# Patient Record
Sex: Male | Born: 1935
Health system: Southern US, Community
[De-identification: ages and names within clinical notes are randomized; demographics above are authoritative.]

## PROBLEM LIST (undated history)

## (undated) DIAGNOSIS — I10 Essential (primary) hypertension: Secondary | ICD-10-CM

## (undated) DIAGNOSIS — H269 Unspecified cataract: Secondary | ICD-10-CM

## (undated) HISTORY — DX: Essential (primary) hypertension: I10

## (undated) HISTORY — PX: NOSE SURGERY: SHX723

## (undated) HISTORY — PX: CATARACT EXTRACTION: SUR2

## (undated) HISTORY — DX: Unspecified cataract: H26.9

## (undated) HISTORY — PX: EYE SURGERY: SHX253

---

## 2001-07-16 ENCOUNTER — Encounter: Admission: RE | Admit: 2001-07-16 | Discharge: 2001-07-16 | Payer: Self-pay | Admitting: Internal Medicine

## 2001-07-16 ENCOUNTER — Encounter: Payer: Self-pay | Admitting: Internal Medicine

## 2004-09-07 ENCOUNTER — Ambulatory Visit: Payer: Self-pay | Admitting: Internal Medicine

## 2004-09-24 ENCOUNTER — Encounter (INDEPENDENT_AMBULATORY_CARE_PROVIDER_SITE_OTHER): Payer: Self-pay | Admitting: Specialist

## 2004-09-24 ENCOUNTER — Ambulatory Visit: Payer: Self-pay | Admitting: Internal Medicine

## 2009-09-06 ENCOUNTER — Encounter (INDEPENDENT_AMBULATORY_CARE_PROVIDER_SITE_OTHER): Payer: Self-pay | Admitting: *Deleted

## 2009-09-20 ENCOUNTER — Encounter (INDEPENDENT_AMBULATORY_CARE_PROVIDER_SITE_OTHER): Payer: Self-pay | Admitting: *Deleted

## 2009-10-26 ENCOUNTER — Ambulatory Visit: Payer: Self-pay | Admitting: Internal Medicine

## 2009-10-30 ENCOUNTER — Encounter: Admission: RE | Admit: 2009-10-30 | Discharge: 2009-10-30 | Payer: Self-pay | Admitting: General Surgery

## 2010-04-10 NOTE — Letter (Signed)
Summary: Previsit letter  [facility]  [address] [place], [place] [address]   Phone: [phone]  Fax: [phone]       09/20/2009 MRN: [id number]  Robert Holder [address] [place], [place]  [address]  Dear [name],  Welcome to the Gastroenterology Division at [facility].    You are scheduled to see a nurse for your pre-procedure visit on 10-26-09 at 3:30p.m. on the 3rd floor at [facility], [address]  We ask that you try to arrive at our office 15 minutes prior to your appointment time to allow for check-in.  Your nurse visit will consist of discussing your medical and surgical history, your immediate family medical history, and your medications.    Please bring a complete list of all your medications or, if you prefer, bring the medication bottles and we will list them.  We will need to be aware of both prescribed and over the counter drugs.  We will need to know exact dosage information as well.  If you are on blood thinners (Coumadin, Plavix, Aggrenox, Ticlid, etc.) please call our office today/prior to your appointment, as we need to consult with your physician about holding your medication.   Please be prepared to read and sign documents such as consent forms, a financial agreement, and acknowledgement forms.  If necessary, and with your consent, a friend or relative is welcome to sit-in on the nurse visit with you.  Please bring your insurance card so that we may make a copy of it.  If your insurance requires a referral to see a specialist, please bring your referral form from your primary care physician.  No co-pay is required for this nurse visit.     If you cannot keep your appointment, please call [phone] to cancel or reschedule prior to your appointment date.  This allows [place] the opportunity to schedule an appointment for another patient in need of care.    Thank you for choosing [facility] for your medical  needs.  We appreciate the opportunity to care for you.  Please visit [place] at our website  to learn more about our practice.                     Sincerely.                                                                                                                   The Gastroenterology Division

## 2010-04-10 NOTE — Letter (Signed)
Summary: Colonoscopy Letter  [facility]  [address] [place], [place] [address]   Phone: [phone]  Fax: [phone]      September 06, 2009 MRN: [id number]   Robert Holder [address] [place], [place]  [address]   Dear [name],   According to your medical record, it is time for you to schedule a Colonoscopy. The American Cancer Society recommends this procedure as a method to detect early colon cancer. Patients with a family history of colon cancer, or a personal history of colon polyps or inflammatory bowel disease are at increased risk.  This letter has been generated based on the recommendations made at the time of your procedure. If you feel that in your particular situation this may no longer apply, please contact our office.  Please call our office at [phone] to schedule this appointment or to update your records at your earliest convenience.  Thank you for cooperating with [place] to provide you with the very best care possible.   Sincerely,  Roxy Cedar, M.D.  [facility] [phone]

## 2010-04-10 NOTE — Miscellaneous (Signed)
Summary: [facility] Previsit/prep  Clinical Lists Changes     Robert Holder says he's having left inguinal hernia surgery August 29 and wants to cancel his colonoscopy scheduled for 11/21/09.  He will call back and [facility] his PV and Colon after he has recovered from surgery.

## 2010-05-11 ENCOUNTER — Other Ambulatory Visit: Payer: Self-pay | Admitting: Dermatology

## 2010-11-22 ENCOUNTER — Other Ambulatory Visit: Payer: Self-pay | Admitting: Dermatology

## 2011-04-29 DIAGNOSIS — E039 Hypothyroidism, unspecified: Secondary | ICD-10-CM | POA: Diagnosis not present

## 2011-04-29 DIAGNOSIS — E871 Hypo-osmolality and hyponatremia: Secondary | ICD-10-CM | POA: Diagnosis not present

## 2011-04-29 DIAGNOSIS — E785 Hyperlipidemia, unspecified: Secondary | ICD-10-CM | POA: Diagnosis not present

## 2011-04-29 DIAGNOSIS — I10 Essential (primary) hypertension: Secondary | ICD-10-CM | POA: Diagnosis not present

## 2011-05-15 ENCOUNTER — Telehealth: Payer: Self-pay | Admitting: Internal Medicine

## 2011-05-15 NOTE — Telephone Encounter (Signed)
Pt stated he will call when ready

## 2011-08-08 ENCOUNTER — Other Ambulatory Visit: Payer: Self-pay | Admitting: Dermatology

## 2011-08-08 DIAGNOSIS — D239 Other benign neoplasm of skin, unspecified: Secondary | ICD-10-CM | POA: Diagnosis not present

## 2011-08-08 DIAGNOSIS — C44319 Basal cell carcinoma of skin of other parts of face: Secondary | ICD-10-CM | POA: Diagnosis not present

## 2011-08-08 DIAGNOSIS — L570 Actinic keratosis: Secondary | ICD-10-CM | POA: Diagnosis not present

## 2011-08-08 DIAGNOSIS — L821 Other seborrheic keratosis: Secondary | ICD-10-CM | POA: Diagnosis not present

## 2011-09-09 DIAGNOSIS — C44319 Basal cell carcinoma of skin of other parts of face: Secondary | ICD-10-CM | POA: Diagnosis not present

## 2011-09-16 ENCOUNTER — Other Ambulatory Visit: Payer: Self-pay | Admitting: Dermatology

## 2011-09-16 DIAGNOSIS — C44319 Basal cell carcinoma of skin of other parts of face: Secondary | ICD-10-CM | POA: Diagnosis not present

## 2011-09-16 DIAGNOSIS — L821 Other seborrheic keratosis: Secondary | ICD-10-CM | POA: Diagnosis not present

## 2011-10-24 DIAGNOSIS — Z125 Encounter for screening for malignant neoplasm of prostate: Secondary | ICD-10-CM | POA: Diagnosis not present

## 2011-10-24 DIAGNOSIS — I10 Essential (primary) hypertension: Secondary | ICD-10-CM | POA: Diagnosis not present

## 2011-10-24 DIAGNOSIS — E785 Hyperlipidemia, unspecified: Secondary | ICD-10-CM | POA: Diagnosis not present

## 2011-10-24 DIAGNOSIS — E039 Hypothyroidism, unspecified: Secondary | ICD-10-CM | POA: Diagnosis not present

## 2011-10-29 DIAGNOSIS — Z1212 Encounter for screening for malignant neoplasm of rectum: Secondary | ICD-10-CM | POA: Diagnosis not present

## 2011-10-31 DIAGNOSIS — I10 Essential (primary) hypertension: Secondary | ICD-10-CM | POA: Diagnosis not present

## 2011-10-31 DIAGNOSIS — H3530 Unspecified macular degeneration: Secondary | ICD-10-CM | POA: Diagnosis not present

## 2011-10-31 DIAGNOSIS — E039 Hypothyroidism, unspecified: Secondary | ICD-10-CM | POA: Diagnosis not present

## 2011-10-31 DIAGNOSIS — Z0000 Encounter for general adult medical examination without abnormal findings: Secondary | ICD-10-CM | POA: Diagnosis not present

## 2011-11-19 DIAGNOSIS — Z23 Encounter for immunization: Secondary | ICD-10-CM | POA: Diagnosis not present

## 2011-12-27 DIAGNOSIS — N400 Enlarged prostate without lower urinary tract symptoms: Secondary | ICD-10-CM | POA: Diagnosis not present

## 2011-12-27 DIAGNOSIS — R972 Elevated prostate specific antigen [PSA]: Secondary | ICD-10-CM | POA: Diagnosis not present

## 2011-12-27 DIAGNOSIS — N529 Male erectile dysfunction, unspecified: Secondary | ICD-10-CM | POA: Diagnosis not present

## 2012-02-19 DIAGNOSIS — D1801 Hemangioma of skin and subcutaneous tissue: Secondary | ICD-10-CM | POA: Diagnosis not present

## 2012-02-19 DIAGNOSIS — L821 Other seborrheic keratosis: Secondary | ICD-10-CM | POA: Diagnosis not present

## 2012-02-19 DIAGNOSIS — D239 Other benign neoplasm of skin, unspecified: Secondary | ICD-10-CM | POA: Diagnosis not present

## 2012-02-19 DIAGNOSIS — D485 Neoplasm of uncertain behavior of skin: Secondary | ICD-10-CM | POA: Diagnosis not present

## 2012-02-19 DIAGNOSIS — L570 Actinic keratosis: Secondary | ICD-10-CM | POA: Diagnosis not present

## 2012-03-10 DIAGNOSIS — Z961 Presence of intraocular lens: Secondary | ICD-10-CM | POA: Diagnosis not present

## 2012-03-19 ENCOUNTER — Other Ambulatory Visit: Payer: Self-pay | Admitting: Dermatology

## 2012-03-19 DIAGNOSIS — L820 Inflamed seborrheic keratosis: Secondary | ICD-10-CM | POA: Diagnosis not present

## 2012-03-19 DIAGNOSIS — D485 Neoplasm of uncertain behavior of skin: Secondary | ICD-10-CM | POA: Diagnosis not present

## 2012-03-19 DIAGNOSIS — C44519 Basal cell carcinoma of skin of other part of trunk: Secondary | ICD-10-CM | POA: Diagnosis not present

## 2012-03-19 DIAGNOSIS — L821 Other seborrheic keratosis: Secondary | ICD-10-CM | POA: Diagnosis not present

## 2012-06-04 DIAGNOSIS — I10 Essential (primary) hypertension: Secondary | ICD-10-CM | POA: Diagnosis not present

## 2012-06-04 DIAGNOSIS — E871 Hypo-osmolality and hyponatremia: Secondary | ICD-10-CM | POA: Diagnosis not present

## 2012-06-04 DIAGNOSIS — E785 Hyperlipidemia, unspecified: Secondary | ICD-10-CM | POA: Diagnosis not present

## 2012-06-04 DIAGNOSIS — E039 Hypothyroidism, unspecified: Secondary | ICD-10-CM | POA: Diagnosis not present

## 2012-06-04 DIAGNOSIS — H3530 Unspecified macular degeneration: Secondary | ICD-10-CM | POA: Diagnosis not present

## 2012-06-04 DIAGNOSIS — Z1331 Encounter for screening for depression: Secondary | ICD-10-CM | POA: Diagnosis not present

## 2012-06-04 DIAGNOSIS — N401 Enlarged prostate with lower urinary tract symptoms: Secondary | ICD-10-CM | POA: Diagnosis not present

## 2012-08-20 DIAGNOSIS — Z85828 Personal history of other malignant neoplasm of skin: Secondary | ICD-10-CM | POA: Diagnosis not present

## 2012-08-20 DIAGNOSIS — L570 Actinic keratosis: Secondary | ICD-10-CM | POA: Diagnosis not present

## 2012-08-20 DIAGNOSIS — L821 Other seborrheic keratosis: Secondary | ICD-10-CM | POA: Diagnosis not present

## 2012-08-20 DIAGNOSIS — L820 Inflamed seborrheic keratosis: Secondary | ICD-10-CM | POA: Diagnosis not present

## 2012-11-27 DIAGNOSIS — I10 Essential (primary) hypertension: Secondary | ICD-10-CM | POA: Diagnosis not present

## 2012-11-27 DIAGNOSIS — Z125 Encounter for screening for malignant neoplasm of prostate: Secondary | ICD-10-CM | POA: Diagnosis not present

## 2012-11-27 DIAGNOSIS — E039 Hypothyroidism, unspecified: Secondary | ICD-10-CM | POA: Diagnosis not present

## 2012-11-27 DIAGNOSIS — E785 Hyperlipidemia, unspecified: Secondary | ICD-10-CM | POA: Diagnosis not present

## 2012-12-02 DIAGNOSIS — Z1212 Encounter for screening for malignant neoplasm of rectum: Secondary | ICD-10-CM | POA: Diagnosis not present

## 2012-12-04 DIAGNOSIS — E785 Hyperlipidemia, unspecified: Secondary | ICD-10-CM | POA: Diagnosis not present

## 2012-12-04 DIAGNOSIS — D72829 Elevated white blood cell count, unspecified: Secondary | ICD-10-CM | POA: Diagnosis not present

## 2012-12-04 DIAGNOSIS — C4491 Basal cell carcinoma of skin, unspecified: Secondary | ICD-10-CM | POA: Diagnosis not present

## 2012-12-04 DIAGNOSIS — K3189 Other diseases of stomach and duodenum: Secondary | ICD-10-CM | POA: Diagnosis not present

## 2012-12-04 DIAGNOSIS — M1990 Unspecified osteoarthritis, unspecified site: Secondary | ICD-10-CM | POA: Diagnosis not present

## 2012-12-04 DIAGNOSIS — I10 Essential (primary) hypertension: Secondary | ICD-10-CM | POA: Diagnosis not present

## 2012-12-04 DIAGNOSIS — Z0000 Encounter for general adult medical examination without abnormal findings: Secondary | ICD-10-CM | POA: Diagnosis not present

## 2012-12-04 DIAGNOSIS — Z23 Encounter for immunization: Secondary | ICD-10-CM | POA: Diagnosis not present

## 2012-12-04 DIAGNOSIS — G4700 Insomnia, unspecified: Secondary | ICD-10-CM | POA: Diagnosis not present

## 2013-01-06 DIAGNOSIS — R351 Nocturia: Secondary | ICD-10-CM | POA: Diagnosis not present

## 2013-03-12 DIAGNOSIS — D3130 Benign neoplasm of unspecified choroid: Secondary | ICD-10-CM | POA: Diagnosis not present

## 2013-03-25 DIAGNOSIS — D1801 Hemangioma of skin and subcutaneous tissue: Secondary | ICD-10-CM | POA: Diagnosis not present

## 2013-03-25 DIAGNOSIS — D239 Other benign neoplasm of skin, unspecified: Secondary | ICD-10-CM | POA: Diagnosis not present

## 2013-03-25 DIAGNOSIS — L821 Other seborrheic keratosis: Secondary | ICD-10-CM | POA: Diagnosis not present

## 2013-03-25 DIAGNOSIS — L820 Inflamed seborrheic keratosis: Secondary | ICD-10-CM | POA: Diagnosis not present

## 2013-03-25 DIAGNOSIS — Z85828 Personal history of other malignant neoplasm of skin: Secondary | ICD-10-CM | POA: Diagnosis not present

## 2013-03-25 DIAGNOSIS — L570 Actinic keratosis: Secondary | ICD-10-CM | POA: Diagnosis not present

## 2013-04-06 DIAGNOSIS — Z6826 Body mass index (BMI) 26.0-26.9, adult: Secondary | ICD-10-CM | POA: Diagnosis not present

## 2013-04-06 DIAGNOSIS — J069 Acute upper respiratory infection, unspecified: Secondary | ICD-10-CM | POA: Diagnosis not present

## 2013-04-06 DIAGNOSIS — I10 Essential (primary) hypertension: Secondary | ICD-10-CM | POA: Diagnosis not present

## 2013-04-06 DIAGNOSIS — R059 Cough, unspecified: Secondary | ICD-10-CM | POA: Diagnosis not present

## 2013-04-06 DIAGNOSIS — R05 Cough: Secondary | ICD-10-CM | POA: Diagnosis not present

## 2013-06-03 DIAGNOSIS — E785 Hyperlipidemia, unspecified: Secondary | ICD-10-CM | POA: Diagnosis not present

## 2013-06-03 DIAGNOSIS — H3530 Unspecified macular degeneration: Secondary | ICD-10-CM | POA: Diagnosis not present

## 2013-06-03 DIAGNOSIS — E039 Hypothyroidism, unspecified: Secondary | ICD-10-CM | POA: Diagnosis not present

## 2013-06-03 DIAGNOSIS — N401 Enlarged prostate with lower urinary tract symptoms: Secondary | ICD-10-CM | POA: Diagnosis not present

## 2013-06-03 DIAGNOSIS — N138 Other obstructive and reflux uropathy: Secondary | ICD-10-CM | POA: Diagnosis not present

## 2013-06-03 DIAGNOSIS — I10 Essential (primary) hypertension: Secondary | ICD-10-CM | POA: Diagnosis not present

## 2013-09-23 ENCOUNTER — Other Ambulatory Visit: Payer: Self-pay | Admitting: Dermatology

## 2013-09-23 DIAGNOSIS — D239 Other benign neoplasm of skin, unspecified: Secondary | ICD-10-CM | POA: Diagnosis not present

## 2013-09-23 DIAGNOSIS — L570 Actinic keratosis: Secondary | ICD-10-CM | POA: Diagnosis not present

## 2013-09-23 DIAGNOSIS — L820 Inflamed seborrheic keratosis: Secondary | ICD-10-CM | POA: Diagnosis not present

## 2013-09-23 DIAGNOSIS — C44519 Basal cell carcinoma of skin of other part of trunk: Secondary | ICD-10-CM | POA: Diagnosis not present

## 2013-09-23 DIAGNOSIS — L821 Other seborrheic keratosis: Secondary | ICD-10-CM | POA: Diagnosis not present

## 2013-09-23 DIAGNOSIS — D485 Neoplasm of uncertain behavior of skin: Secondary | ICD-10-CM | POA: Diagnosis not present

## 2013-09-23 DIAGNOSIS — Z85828 Personal history of other malignant neoplasm of skin: Secondary | ICD-10-CM | POA: Diagnosis not present

## 2013-12-06 DIAGNOSIS — E039 Hypothyroidism, unspecified: Secondary | ICD-10-CM | POA: Diagnosis not present

## 2013-12-06 DIAGNOSIS — E785 Hyperlipidemia, unspecified: Secondary | ICD-10-CM | POA: Diagnosis not present

## 2013-12-06 DIAGNOSIS — Z125 Encounter for screening for malignant neoplasm of prostate: Secondary | ICD-10-CM | POA: Diagnosis not present

## 2013-12-06 DIAGNOSIS — I10 Essential (primary) hypertension: Secondary | ICD-10-CM | POA: Diagnosis not present

## 2013-12-09 DIAGNOSIS — K30 Functional dyspepsia: Secondary | ICD-10-CM | POA: Diagnosis not present

## 2013-12-09 DIAGNOSIS — C4491 Basal cell carcinoma of skin, unspecified: Secondary | ICD-10-CM | POA: Diagnosis not present

## 2013-12-09 DIAGNOSIS — Z008 Encounter for other general examination: Secondary | ICD-10-CM | POA: Diagnosis not present

## 2013-12-09 DIAGNOSIS — Z1389 Encounter for screening for other disorder: Secondary | ICD-10-CM | POA: Diagnosis not present

## 2013-12-09 DIAGNOSIS — I10 Essential (primary) hypertension: Secondary | ICD-10-CM | POA: Diagnosis not present

## 2013-12-09 DIAGNOSIS — N401 Enlarged prostate with lower urinary tract symptoms: Secondary | ICD-10-CM | POA: Diagnosis not present

## 2013-12-09 DIAGNOSIS — F5104 Psychophysiologic insomnia: Secondary | ICD-10-CM | POA: Diagnosis not present

## 2013-12-09 DIAGNOSIS — Z23 Encounter for immunization: Secondary | ICD-10-CM | POA: Diagnosis not present

## 2013-12-09 DIAGNOSIS — E871 Hypo-osmolality and hyponatremia: Secondary | ICD-10-CM | POA: Diagnosis not present

## 2013-12-09 DIAGNOSIS — D72829 Elevated white blood cell count, unspecified: Secondary | ICD-10-CM | POA: Diagnosis not present

## 2013-12-13 DIAGNOSIS — Z1212 Encounter for screening for malignant neoplasm of rectum: Secondary | ICD-10-CM | POA: Diagnosis not present

## 2014-01-07 DIAGNOSIS — R351 Nocturia: Secondary | ICD-10-CM | POA: Diagnosis not present

## 2014-01-07 DIAGNOSIS — N401 Enlarged prostate with lower urinary tract symptoms: Secondary | ICD-10-CM | POA: Diagnosis not present

## 2014-01-07 DIAGNOSIS — R972 Elevated prostate specific antigen [PSA]: Secondary | ICD-10-CM | POA: Diagnosis not present

## 2014-03-16 DIAGNOSIS — H26493 Other secondary cataract, bilateral: Secondary | ICD-10-CM | POA: Diagnosis not present

## 2014-03-16 DIAGNOSIS — D3131 Benign neoplasm of right choroid: Secondary | ICD-10-CM | POA: Diagnosis not present

## 2014-03-16 DIAGNOSIS — Z961 Presence of intraocular lens: Secondary | ICD-10-CM | POA: Diagnosis not present

## 2014-03-16 DIAGNOSIS — H5212 Myopia, left eye: Secondary | ICD-10-CM | POA: Diagnosis not present

## 2014-03-24 DIAGNOSIS — R590 Localized enlarged lymph nodes: Secondary | ICD-10-CM | POA: Diagnosis not present

## 2014-03-24 DIAGNOSIS — I10 Essential (primary) hypertension: Secondary | ICD-10-CM | POA: Diagnosis not present

## 2014-03-24 DIAGNOSIS — L820 Inflamed seborrheic keratosis: Secondary | ICD-10-CM | POA: Diagnosis not present

## 2014-03-24 DIAGNOSIS — L570 Actinic keratosis: Secondary | ICD-10-CM | POA: Diagnosis not present

## 2014-03-24 DIAGNOSIS — D2271 Melanocytic nevi of right lower limb, including hip: Secondary | ICD-10-CM | POA: Diagnosis not present

## 2014-03-24 DIAGNOSIS — L821 Other seborrheic keratosis: Secondary | ICD-10-CM | POA: Diagnosis not present

## 2014-03-24 DIAGNOSIS — Z85828 Personal history of other malignant neoplasm of skin: Secondary | ICD-10-CM | POA: Diagnosis not present

## 2014-03-24 DIAGNOSIS — Z6826 Body mass index (BMI) 26.0-26.9, adult: Secondary | ICD-10-CM | POA: Diagnosis not present

## 2014-04-04 ENCOUNTER — Other Ambulatory Visit: Payer: Self-pay | Admitting: Internal Medicine

## 2014-04-04 DIAGNOSIS — R590 Localized enlarged lymph nodes: Secondary | ICD-10-CM

## 2014-04-07 ENCOUNTER — Ambulatory Visit
Admission: RE | Admit: 2014-04-07 | Discharge: 2014-04-07 | Disposition: A | Discharge status: Home / Self Care | Payer: Medicare Other | Source: Ambulatory Visit | Attending: Internal Medicine | Admitting: Internal Medicine

## 2014-04-07 DIAGNOSIS — R599 Enlarged lymph nodes, unspecified: Secondary | ICD-10-CM | POA: Diagnosis not present

## 2014-04-07 DIAGNOSIS — R590 Localized enlarged lymph nodes: Secondary | ICD-10-CM

## 2014-04-07 MED ORDER — IOHEXOL 300 MG/ML  SOLN
75.0000 mL | Freq: Once | INTRAMUSCULAR | Status: AC | PRN
  Administered 2014-04-07: 75 mL via INTRAVENOUS

## 2014-06-17 DIAGNOSIS — E785 Hyperlipidemia, unspecified: Secondary | ICD-10-CM | POA: Diagnosis not present

## 2014-06-17 DIAGNOSIS — R590 Localized enlarged lymph nodes: Secondary | ICD-10-CM | POA: Diagnosis not present

## 2014-06-17 DIAGNOSIS — Z23 Encounter for immunization: Secondary | ICD-10-CM | POA: Diagnosis not present

## 2014-06-17 DIAGNOSIS — Z1389 Encounter for screening for other disorder: Secondary | ICD-10-CM | POA: Diagnosis not present

## 2014-06-17 DIAGNOSIS — E871 Hypo-osmolality and hyponatremia: Secondary | ICD-10-CM | POA: Diagnosis not present

## 2014-06-17 DIAGNOSIS — I10 Essential (primary) hypertension: Secondary | ICD-10-CM | POA: Diagnosis not present

## 2014-06-17 DIAGNOSIS — Z6826 Body mass index (BMI) 26.0-26.9, adult: Secondary | ICD-10-CM | POA: Diagnosis not present

## 2014-06-17 DIAGNOSIS — E039 Hypothyroidism, unspecified: Secondary | ICD-10-CM | POA: Diagnosis not present

## 2014-07-07 DIAGNOSIS — R972 Elevated prostate specific antigen [PSA]: Secondary | ICD-10-CM | POA: Diagnosis not present

## 2014-09-26 DIAGNOSIS — D225 Melanocytic nevi of trunk: Secondary | ICD-10-CM | POA: Diagnosis not present

## 2014-09-26 DIAGNOSIS — L570 Actinic keratosis: Secondary | ICD-10-CM | POA: Diagnosis not present

## 2014-09-26 DIAGNOSIS — L821 Other seborrheic keratosis: Secondary | ICD-10-CM | POA: Diagnosis not present

## 2014-09-26 DIAGNOSIS — D2272 Melanocytic nevi of left lower limb, including hip: Secondary | ICD-10-CM | POA: Diagnosis not present

## 2014-09-26 DIAGNOSIS — C44319 Basal cell carcinoma of skin of other parts of face: Secondary | ICD-10-CM | POA: Diagnosis not present

## 2014-09-26 DIAGNOSIS — L820 Inflamed seborrheic keratosis: Secondary | ICD-10-CM | POA: Diagnosis not present

## 2014-09-26 DIAGNOSIS — D485 Neoplasm of uncertain behavior of skin: Secondary | ICD-10-CM | POA: Diagnosis not present

## 2014-09-26 DIAGNOSIS — Z85828 Personal history of other malignant neoplasm of skin: Secondary | ICD-10-CM | POA: Diagnosis not present

## 2014-09-26 DIAGNOSIS — D2271 Melanocytic nevi of right lower limb, including hip: Secondary | ICD-10-CM | POA: Diagnosis not present

## 2014-09-26 DIAGNOSIS — D2239 Melanocytic nevi of other parts of face: Secondary | ICD-10-CM | POA: Diagnosis not present

## 2014-09-26 DIAGNOSIS — D2261 Melanocytic nevi of right upper limb, including shoulder: Secondary | ICD-10-CM | POA: Diagnosis not present

## 2014-09-26 DIAGNOSIS — D2262 Melanocytic nevi of left upper limb, including shoulder: Secondary | ICD-10-CM | POA: Diagnosis not present

## 2014-09-26 DIAGNOSIS — L905 Scar conditions and fibrosis of skin: Secondary | ICD-10-CM | POA: Diagnosis not present

## 2014-10-07 DIAGNOSIS — C44319 Basal cell carcinoma of skin of other parts of face: Secondary | ICD-10-CM | POA: Diagnosis not present

## 2014-10-07 DIAGNOSIS — Z85828 Personal history of other malignant neoplasm of skin: Secondary | ICD-10-CM | POA: Diagnosis not present

## 2014-12-08 DIAGNOSIS — I10 Essential (primary) hypertension: Secondary | ICD-10-CM | POA: Diagnosis not present

## 2014-12-08 DIAGNOSIS — Z0000 Encounter for general adult medical examination without abnormal findings: Secondary | ICD-10-CM | POA: Diagnosis not present

## 2014-12-08 DIAGNOSIS — E039 Hypothyroidism, unspecified: Secondary | ICD-10-CM | POA: Diagnosis not present

## 2014-12-08 DIAGNOSIS — R972 Elevated prostate specific antigen [PSA]: Secondary | ICD-10-CM | POA: Diagnosis not present

## 2014-12-08 DIAGNOSIS — E785 Hyperlipidemia, unspecified: Secondary | ICD-10-CM | POA: Diagnosis not present

## 2014-12-13 DIAGNOSIS — Z0000 Encounter for general adult medical examination without abnormal findings: Secondary | ICD-10-CM | POA: Diagnosis not present

## 2014-12-15 DIAGNOSIS — Z0000 Encounter for general adult medical examination without abnormal findings: Secondary | ICD-10-CM | POA: Diagnosis not present

## 2014-12-15 DIAGNOSIS — E039 Hypothyroidism, unspecified: Secondary | ICD-10-CM | POA: Diagnosis not present

## 2014-12-15 DIAGNOSIS — N401 Enlarged prostate with lower urinary tract symptoms: Secondary | ICD-10-CM | POA: Diagnosis not present

## 2014-12-15 DIAGNOSIS — E871 Hypo-osmolality and hyponatremia: Secondary | ICD-10-CM | POA: Diagnosis not present

## 2014-12-15 DIAGNOSIS — F5104 Psychophysiologic insomnia: Secondary | ICD-10-CM | POA: Diagnosis not present

## 2014-12-15 DIAGNOSIS — Z6825 Body mass index (BMI) 25.0-25.9, adult: Secondary | ICD-10-CM | POA: Diagnosis not present

## 2014-12-15 DIAGNOSIS — E785 Hyperlipidemia, unspecified: Secondary | ICD-10-CM | POA: Diagnosis not present

## 2014-12-15 DIAGNOSIS — Z23 Encounter for immunization: Secondary | ICD-10-CM | POA: Diagnosis not present

## 2014-12-15 DIAGNOSIS — H3530 Unspecified macular degeneration: Secondary | ICD-10-CM | POA: Diagnosis not present

## 2014-12-15 DIAGNOSIS — I10 Essential (primary) hypertension: Secondary | ICD-10-CM | POA: Diagnosis not present

## 2014-12-15 DIAGNOSIS — M1990 Unspecified osteoarthritis, unspecified site: Secondary | ICD-10-CM | POA: Diagnosis not present

## 2014-12-15 DIAGNOSIS — C4491 Basal cell carcinoma of skin, unspecified: Secondary | ICD-10-CM | POA: Diagnosis not present

## 2014-12-16 DIAGNOSIS — N5201 Erectile dysfunction due to arterial insufficiency: Secondary | ICD-10-CM | POA: Diagnosis not present

## 2014-12-16 DIAGNOSIS — R972 Elevated prostate specific antigen [PSA]: Secondary | ICD-10-CM | POA: Diagnosis not present

## 2014-12-16 DIAGNOSIS — N401 Enlarged prostate with lower urinary tract symptoms: Secondary | ICD-10-CM | POA: Diagnosis not present

## 2014-12-16 DIAGNOSIS — R351 Nocturia: Secondary | ICD-10-CM | POA: Diagnosis not present

## 2014-12-22 DIAGNOSIS — Z1212 Encounter for screening for malignant neoplasm of rectum: Secondary | ICD-10-CM | POA: Diagnosis not present

## 2015-03-20 DIAGNOSIS — H353131 Nonexudative age-related macular degeneration, bilateral, early dry stage: Secondary | ICD-10-CM | POA: Diagnosis not present

## 2015-03-20 DIAGNOSIS — D3131 Benign neoplasm of right choroid: Secondary | ICD-10-CM | POA: Diagnosis not present

## 2015-03-20 DIAGNOSIS — H26493 Other secondary cataract, bilateral: Secondary | ICD-10-CM | POA: Diagnosis not present

## 2015-03-29 DIAGNOSIS — Z85828 Personal history of other malignant neoplasm of skin: Secondary | ICD-10-CM | POA: Diagnosis not present

## 2015-03-29 DIAGNOSIS — D2261 Melanocytic nevi of right upper limb, including shoulder: Secondary | ICD-10-CM | POA: Diagnosis not present

## 2015-03-29 DIAGNOSIS — L821 Other seborrheic keratosis: Secondary | ICD-10-CM | POA: Diagnosis not present

## 2015-03-29 DIAGNOSIS — D2272 Melanocytic nevi of left lower limb, including hip: Secondary | ICD-10-CM | POA: Diagnosis not present

## 2015-03-29 DIAGNOSIS — L820 Inflamed seborrheic keratosis: Secondary | ICD-10-CM | POA: Diagnosis not present

## 2015-03-29 DIAGNOSIS — D485 Neoplasm of uncertain behavior of skin: Secondary | ICD-10-CM | POA: Diagnosis not present

## 2015-03-29 DIAGNOSIS — L905 Scar conditions and fibrosis of skin: Secondary | ICD-10-CM | POA: Diagnosis not present

## 2015-03-29 DIAGNOSIS — D2271 Melanocytic nevi of right lower limb, including hip: Secondary | ICD-10-CM | POA: Diagnosis not present

## 2015-03-29 DIAGNOSIS — D225 Melanocytic nevi of trunk: Secondary | ICD-10-CM | POA: Diagnosis not present

## 2015-07-06 DIAGNOSIS — I10 Essential (primary) hypertension: Secondary | ICD-10-CM | POA: Diagnosis not present

## 2015-07-06 DIAGNOSIS — Z0000 Encounter for general adult medical examination without abnormal findings: Secondary | ICD-10-CM | POA: Diagnosis not present

## 2015-07-06 DIAGNOSIS — E871 Hypo-osmolality and hyponatremia: Secondary | ICD-10-CM | POA: Diagnosis not present

## 2015-07-06 DIAGNOSIS — Z6824 Body mass index (BMI) 24.0-24.9, adult: Secondary | ICD-10-CM | POA: Diagnosis not present

## 2015-07-06 DIAGNOSIS — N401 Enlarged prostate with lower urinary tract symptoms: Secondary | ICD-10-CM | POA: Diagnosis not present

## 2015-07-06 DIAGNOSIS — R251 Tremor, unspecified: Secondary | ICD-10-CM | POA: Diagnosis not present

## 2015-07-06 DIAGNOSIS — E784 Other hyperlipidemia: Secondary | ICD-10-CM | POA: Diagnosis not present

## 2015-07-06 DIAGNOSIS — E038 Other specified hypothyroidism: Secondary | ICD-10-CM | POA: Diagnosis not present

## 2015-07-06 DIAGNOSIS — C4491 Basal cell carcinoma of skin, unspecified: Secondary | ICD-10-CM | POA: Diagnosis not present

## 2015-09-21 DIAGNOSIS — D1801 Hemangioma of skin and subcutaneous tissue: Secondary | ICD-10-CM | POA: Diagnosis not present

## 2015-09-21 DIAGNOSIS — D2272 Melanocytic nevi of left lower limb, including hip: Secondary | ICD-10-CM | POA: Diagnosis not present

## 2015-09-21 DIAGNOSIS — D225 Melanocytic nevi of trunk: Secondary | ICD-10-CM | POA: Diagnosis not present

## 2015-09-21 DIAGNOSIS — L821 Other seborrheic keratosis: Secondary | ICD-10-CM | POA: Diagnosis not present

## 2015-09-21 DIAGNOSIS — D2261 Melanocytic nevi of right upper limb, including shoulder: Secondary | ICD-10-CM | POA: Diagnosis not present

## 2015-09-21 DIAGNOSIS — D485 Neoplasm of uncertain behavior of skin: Secondary | ICD-10-CM | POA: Diagnosis not present

## 2015-09-21 DIAGNOSIS — L814 Other melanin hyperpigmentation: Secondary | ICD-10-CM | POA: Diagnosis not present

## 2015-09-21 DIAGNOSIS — L309 Dermatitis, unspecified: Secondary | ICD-10-CM | POA: Diagnosis not present

## 2015-09-21 DIAGNOSIS — C4441 Basal cell carcinoma of skin of scalp and neck: Secondary | ICD-10-CM | POA: Diagnosis not present

## 2015-09-21 DIAGNOSIS — D2271 Melanocytic nevi of right lower limb, including hip: Secondary | ICD-10-CM | POA: Diagnosis not present

## 2015-09-21 DIAGNOSIS — L905 Scar conditions and fibrosis of skin: Secondary | ICD-10-CM | POA: Diagnosis not present

## 2015-09-21 DIAGNOSIS — Z85828 Personal history of other malignant neoplasm of skin: Secondary | ICD-10-CM | POA: Diagnosis not present

## 2015-11-02 DIAGNOSIS — C4441 Basal cell carcinoma of skin of scalp and neck: Secondary | ICD-10-CM | POA: Diagnosis not present

## 2015-11-02 DIAGNOSIS — Z85828 Personal history of other malignant neoplasm of skin: Secondary | ICD-10-CM | POA: Diagnosis not present

## 2015-11-25 DIAGNOSIS — Z23 Encounter for immunization: Secondary | ICD-10-CM | POA: Diagnosis not present

## 2015-12-15 DIAGNOSIS — N401 Enlarged prostate with lower urinary tract symptoms: Secondary | ICD-10-CM | POA: Diagnosis not present

## 2015-12-22 DIAGNOSIS — R972 Elevated prostate specific antigen [PSA]: Secondary | ICD-10-CM | POA: Diagnosis not present

## 2015-12-22 DIAGNOSIS — N401 Enlarged prostate with lower urinary tract symptoms: Secondary | ICD-10-CM | POA: Diagnosis not present

## 2015-12-22 DIAGNOSIS — R351 Nocturia: Secondary | ICD-10-CM | POA: Diagnosis not present

## 2015-12-28 DIAGNOSIS — I10 Essential (primary) hypertension: Secondary | ICD-10-CM | POA: Diagnosis not present

## 2015-12-28 DIAGNOSIS — E038 Other specified hypothyroidism: Secondary | ICD-10-CM | POA: Diagnosis not present

## 2015-12-28 DIAGNOSIS — E784 Other hyperlipidemia: Secondary | ICD-10-CM | POA: Diagnosis not present

## 2015-12-28 DIAGNOSIS — Z125 Encounter for screening for malignant neoplasm of prostate: Secondary | ICD-10-CM | POA: Diagnosis not present

## 2015-12-29 DIAGNOSIS — Z1212 Encounter for screening for malignant neoplasm of rectum: Secondary | ICD-10-CM | POA: Diagnosis not present

## 2016-01-04 DIAGNOSIS — R258 Other abnormal involuntary movements: Secondary | ICD-10-CM | POA: Diagnosis not present

## 2016-01-04 DIAGNOSIS — E871 Hypo-osmolality and hyponatremia: Secondary | ICD-10-CM | POA: Diagnosis not present

## 2016-01-04 DIAGNOSIS — K30 Functional dyspepsia: Secondary | ICD-10-CM | POA: Diagnosis not present

## 2016-01-04 DIAGNOSIS — N401 Enlarged prostate with lower urinary tract symptoms: Secondary | ICD-10-CM | POA: Diagnosis not present

## 2016-01-04 DIAGNOSIS — M1990 Unspecified osteoarthritis, unspecified site: Secondary | ICD-10-CM | POA: Diagnosis not present

## 2016-01-04 DIAGNOSIS — E038 Other specified hypothyroidism: Secondary | ICD-10-CM | POA: Diagnosis not present

## 2016-01-04 DIAGNOSIS — Z1389 Encounter for screening for other disorder: Secondary | ICD-10-CM | POA: Diagnosis not present

## 2016-01-04 DIAGNOSIS — F5104 Psychophysiologic insomnia: Secondary | ICD-10-CM | POA: Diagnosis not present

## 2016-01-04 DIAGNOSIS — E784 Other hyperlipidemia: Secondary | ICD-10-CM | POA: Diagnosis not present

## 2016-01-04 DIAGNOSIS — I10 Essential (primary) hypertension: Secondary | ICD-10-CM | POA: Diagnosis not present

## 2016-01-04 DIAGNOSIS — Z6826 Body mass index (BMI) 26.0-26.9, adult: Secondary | ICD-10-CM | POA: Diagnosis not present

## 2016-01-04 DIAGNOSIS — Z0000 Encounter for general adult medical examination without abnormal findings: Secondary | ICD-10-CM | POA: Diagnosis not present

## 2016-03-29 DIAGNOSIS — D3131 Benign neoplasm of right choroid: Secondary | ICD-10-CM | POA: Diagnosis not present

## 2016-03-29 DIAGNOSIS — H353131 Nonexudative age-related macular degeneration, bilateral, early dry stage: Secondary | ICD-10-CM | POA: Diagnosis not present

## 2016-03-29 DIAGNOSIS — H26493 Other secondary cataract, bilateral: Secondary | ICD-10-CM | POA: Diagnosis not present

## 2016-03-29 DIAGNOSIS — H5201 Hypermetropia, right eye: Secondary | ICD-10-CM | POA: Diagnosis not present

## 2016-04-12 DIAGNOSIS — Z85828 Personal history of other malignant neoplasm of skin: Secondary | ICD-10-CM | POA: Diagnosis not present

## 2016-04-12 DIAGNOSIS — L570 Actinic keratosis: Secondary | ICD-10-CM | POA: Diagnosis not present

## 2016-04-12 DIAGNOSIS — D2261 Melanocytic nevi of right upper limb, including shoulder: Secondary | ICD-10-CM | POA: Diagnosis not present

## 2016-04-12 DIAGNOSIS — C44519 Basal cell carcinoma of skin of other part of trunk: Secondary | ICD-10-CM | POA: Diagnosis not present

## 2016-04-12 DIAGNOSIS — L821 Other seborrheic keratosis: Secondary | ICD-10-CM | POA: Diagnosis not present

## 2016-04-12 DIAGNOSIS — D485 Neoplasm of uncertain behavior of skin: Secondary | ICD-10-CM | POA: Diagnosis not present

## 2016-04-12 DIAGNOSIS — D2272 Melanocytic nevi of left lower limb, including hip: Secondary | ICD-10-CM | POA: Diagnosis not present

## 2016-04-12 DIAGNOSIS — D2262 Melanocytic nevi of left upper limb, including shoulder: Secondary | ICD-10-CM | POA: Diagnosis not present

## 2016-04-12 DIAGNOSIS — D2271 Melanocytic nevi of right lower limb, including hip: Secondary | ICD-10-CM | POA: Diagnosis not present

## 2016-07-02 DIAGNOSIS — E038 Other specified hypothyroidism: Secondary | ICD-10-CM | POA: Diagnosis not present

## 2016-07-02 DIAGNOSIS — I10 Essential (primary) hypertension: Secondary | ICD-10-CM | POA: Diagnosis not present

## 2016-07-02 DIAGNOSIS — H3530 Unspecified macular degeneration: Secondary | ICD-10-CM | POA: Diagnosis not present

## 2016-07-02 DIAGNOSIS — Z6826 Body mass index (BMI) 26.0-26.9, adult: Secondary | ICD-10-CM | POA: Diagnosis not present

## 2016-07-02 DIAGNOSIS — R194 Change in bowel habit: Secondary | ICD-10-CM | POA: Diagnosis not present

## 2016-12-04 DIAGNOSIS — L820 Inflamed seborrheic keratosis: Secondary | ICD-10-CM | POA: Diagnosis not present

## 2016-12-04 DIAGNOSIS — Z85828 Personal history of other malignant neoplasm of skin: Secondary | ICD-10-CM | POA: Diagnosis not present

## 2016-12-04 DIAGNOSIS — L821 Other seborrheic keratosis: Secondary | ICD-10-CM | POA: Diagnosis not present

## 2016-12-04 DIAGNOSIS — D1801 Hemangioma of skin and subcutaneous tissue: Secondary | ICD-10-CM | POA: Diagnosis not present

## 2016-12-04 DIAGNOSIS — L570 Actinic keratosis: Secondary | ICD-10-CM | POA: Diagnosis not present

## 2016-12-04 DIAGNOSIS — D2272 Melanocytic nevi of left lower limb, including hip: Secondary | ICD-10-CM | POA: Diagnosis not present

## 2016-12-04 DIAGNOSIS — L918 Other hypertrophic disorders of the skin: Secondary | ICD-10-CM | POA: Diagnosis not present

## 2016-12-04 DIAGNOSIS — D225 Melanocytic nevi of trunk: Secondary | ICD-10-CM | POA: Diagnosis not present

## 2016-12-04 DIAGNOSIS — D2271 Melanocytic nevi of right lower limb, including hip: Secondary | ICD-10-CM | POA: Diagnosis not present

## 2016-12-21 DIAGNOSIS — Z23 Encounter for immunization: Secondary | ICD-10-CM | POA: Diagnosis not present

## 2017-01-10 DIAGNOSIS — E038 Other specified hypothyroidism: Secondary | ICD-10-CM | POA: Diagnosis not present

## 2017-01-10 DIAGNOSIS — R82998 Other abnormal findings in urine: Secondary | ICD-10-CM | POA: Diagnosis not present

## 2017-01-10 DIAGNOSIS — I10 Essential (primary) hypertension: Secondary | ICD-10-CM | POA: Diagnosis not present

## 2017-01-10 DIAGNOSIS — E7849 Other hyperlipidemia: Secondary | ICD-10-CM | POA: Diagnosis not present

## 2017-01-15 DIAGNOSIS — Z1212 Encounter for screening for malignant neoplasm of rectum: Secondary | ICD-10-CM | POA: Diagnosis not present

## 2017-01-17 DIAGNOSIS — F5104 Psychophysiologic insomnia: Secondary | ICD-10-CM | POA: Diagnosis not present

## 2017-01-17 DIAGNOSIS — N401 Enlarged prostate with lower urinary tract symptoms: Secondary | ICD-10-CM | POA: Diagnosis not present

## 2017-01-17 DIAGNOSIS — E038 Other specified hypothyroidism: Secondary | ICD-10-CM | POA: Diagnosis not present

## 2017-01-17 DIAGNOSIS — H3530 Unspecified macular degeneration: Secondary | ICD-10-CM | POA: Diagnosis not present

## 2017-01-17 DIAGNOSIS — I10 Essential (primary) hypertension: Secondary | ICD-10-CM | POA: Diagnosis not present

## 2017-01-17 DIAGNOSIS — Z0000 Encounter for general adult medical examination without abnormal findings: Secondary | ICD-10-CM | POA: Diagnosis not present

## 2017-01-17 DIAGNOSIS — E871 Hypo-osmolality and hyponatremia: Secondary | ICD-10-CM | POA: Diagnosis not present

## 2017-01-17 DIAGNOSIS — Z1389 Encounter for screening for other disorder: Secondary | ICD-10-CM | POA: Diagnosis not present

## 2017-01-17 DIAGNOSIS — Z6826 Body mass index (BMI) 26.0-26.9, adult: Secondary | ICD-10-CM | POA: Diagnosis not present

## 2017-01-17 DIAGNOSIS — E7849 Other hyperlipidemia: Secondary | ICD-10-CM | POA: Diagnosis not present

## 2017-01-17 DIAGNOSIS — R251 Tremor, unspecified: Secondary | ICD-10-CM | POA: Diagnosis not present

## 2017-01-17 DIAGNOSIS — K30 Functional dyspepsia: Secondary | ICD-10-CM | POA: Diagnosis not present

## 2017-01-22 DIAGNOSIS — R351 Nocturia: Secondary | ICD-10-CM | POA: Diagnosis not present

## 2017-01-22 DIAGNOSIS — N401 Enlarged prostate with lower urinary tract symptoms: Secondary | ICD-10-CM | POA: Diagnosis not present

## 2017-01-22 DIAGNOSIS — R972 Elevated prostate specific antigen [PSA]: Secondary | ICD-10-CM | POA: Diagnosis not present

## 2017-04-01 DIAGNOSIS — H35363 Drusen (degenerative) of macula, bilateral: Secondary | ICD-10-CM | POA: Diagnosis not present

## 2017-04-01 DIAGNOSIS — D3131 Benign neoplasm of right choroid: Secondary | ICD-10-CM | POA: Diagnosis not present

## 2017-04-01 DIAGNOSIS — H26493 Other secondary cataract, bilateral: Secondary | ICD-10-CM | POA: Diagnosis not present

## 2017-04-01 DIAGNOSIS — H524 Presbyopia: Secondary | ICD-10-CM | POA: Diagnosis not present

## 2017-04-24 DIAGNOSIS — H26491 Other secondary cataract, right eye: Secondary | ICD-10-CM | POA: Diagnosis not present

## 2017-05-08 DIAGNOSIS — H26492 Other secondary cataract, left eye: Secondary | ICD-10-CM | POA: Diagnosis not present

## 2017-06-04 DIAGNOSIS — L821 Other seborrheic keratosis: Secondary | ICD-10-CM | POA: Diagnosis not present

## 2017-06-04 DIAGNOSIS — D2262 Melanocytic nevi of left upper limb, including shoulder: Secondary | ICD-10-CM | POA: Diagnosis not present

## 2017-06-04 DIAGNOSIS — L308 Other specified dermatitis: Secondary | ICD-10-CM | POA: Diagnosis not present

## 2017-06-04 DIAGNOSIS — L570 Actinic keratosis: Secondary | ICD-10-CM | POA: Diagnosis not present

## 2017-06-04 DIAGNOSIS — D2261 Melanocytic nevi of right upper limb, including shoulder: Secondary | ICD-10-CM | POA: Diagnosis not present

## 2017-06-04 DIAGNOSIS — D1801 Hemangioma of skin and subcutaneous tissue: Secondary | ICD-10-CM | POA: Diagnosis not present

## 2017-06-04 DIAGNOSIS — D2271 Melanocytic nevi of right lower limb, including hip: Secondary | ICD-10-CM | POA: Diagnosis not present

## 2017-06-04 DIAGNOSIS — Z85828 Personal history of other malignant neoplasm of skin: Secondary | ICD-10-CM | POA: Diagnosis not present

## 2017-06-04 DIAGNOSIS — D2272 Melanocytic nevi of left lower limb, including hip: Secondary | ICD-10-CM | POA: Diagnosis not present

## 2017-06-04 DIAGNOSIS — D225 Melanocytic nevi of trunk: Secondary | ICD-10-CM | POA: Diagnosis not present

## 2017-07-31 DIAGNOSIS — E7849 Other hyperlipidemia: Secondary | ICD-10-CM | POA: Diagnosis not present

## 2017-07-31 DIAGNOSIS — I10 Essential (primary) hypertension: Secondary | ICD-10-CM | POA: Diagnosis not present

## 2017-07-31 DIAGNOSIS — H3530 Unspecified macular degeneration: Secondary | ICD-10-CM | POA: Diagnosis not present

## 2017-07-31 DIAGNOSIS — N401 Enlarged prostate with lower urinary tract symptoms: Secondary | ICD-10-CM | POA: Diagnosis not present

## 2017-07-31 DIAGNOSIS — E038 Other specified hypothyroidism: Secondary | ICD-10-CM | POA: Diagnosis not present

## 2017-07-31 DIAGNOSIS — Z6826 Body mass index (BMI) 26.0-26.9, adult: Secondary | ICD-10-CM | POA: Diagnosis not present

## 2017-07-31 DIAGNOSIS — R251 Tremor, unspecified: Secondary | ICD-10-CM | POA: Diagnosis not present

## 2017-12-05 ENCOUNTER — Other Ambulatory Visit: Payer: Self-pay | Admitting: Internal Medicine

## 2017-12-05 ENCOUNTER — Ambulatory Visit
Admission: RE | Admit: 2017-12-05 | Discharge: 2017-12-05 | Disposition: A | Discharge status: Home / Self Care | Payer: Medicare Other | Source: Ambulatory Visit | Attending: Internal Medicine | Admitting: Internal Medicine

## 2017-12-05 DIAGNOSIS — R918 Other nonspecific abnormal finding of lung field: Secondary | ICD-10-CM

## 2017-12-05 DIAGNOSIS — I10 Essential (primary) hypertension: Secondary | ICD-10-CM | POA: Diagnosis not present

## 2017-12-05 DIAGNOSIS — J189 Pneumonia, unspecified organism: Secondary | ICD-10-CM | POA: Diagnosis not present

## 2017-12-05 DIAGNOSIS — R05 Cough: Secondary | ICD-10-CM | POA: Diagnosis not present

## 2017-12-05 DIAGNOSIS — Z6827 Body mass index (BMI) 27.0-27.9, adult: Secondary | ICD-10-CM | POA: Diagnosis not present

## 2017-12-05 MED ORDER — IOHEXOL 300 MG/ML  SOLN
75.0000 mL | Freq: Once | INTRAMUSCULAR | Status: AC | PRN
  Administered 2017-12-05: 75 mL via INTRAVENOUS

## 2017-12-08 ENCOUNTER — Other Ambulatory Visit: Payer: Self-pay | Admitting: Internal Medicine

## 2017-12-18 DIAGNOSIS — D2261 Melanocytic nevi of right upper limb, including shoulder: Secondary | ICD-10-CM | POA: Diagnosis not present

## 2017-12-18 DIAGNOSIS — L918 Other hypertrophic disorders of the skin: Secondary | ICD-10-CM | POA: Diagnosis not present

## 2017-12-18 DIAGNOSIS — L570 Actinic keratosis: Secondary | ICD-10-CM | POA: Diagnosis not present

## 2017-12-18 DIAGNOSIS — D044 Carcinoma in situ of skin of scalp and neck: Secondary | ICD-10-CM | POA: Diagnosis not present

## 2017-12-18 DIAGNOSIS — D2271 Melanocytic nevi of right lower limb, including hip: Secondary | ICD-10-CM | POA: Diagnosis not present

## 2017-12-18 DIAGNOSIS — D2272 Melanocytic nevi of left lower limb, including hip: Secondary | ICD-10-CM | POA: Diagnosis not present

## 2017-12-18 DIAGNOSIS — D225 Melanocytic nevi of trunk: Secondary | ICD-10-CM | POA: Diagnosis not present

## 2017-12-18 DIAGNOSIS — D1801 Hemangioma of skin and subcutaneous tissue: Secondary | ICD-10-CM | POA: Diagnosis not present

## 2017-12-18 DIAGNOSIS — L821 Other seborrheic keratosis: Secondary | ICD-10-CM | POA: Diagnosis not present

## 2017-12-18 DIAGNOSIS — D485 Neoplasm of uncertain behavior of skin: Secondary | ICD-10-CM | POA: Diagnosis not present

## 2017-12-18 DIAGNOSIS — Z85828 Personal history of other malignant neoplasm of skin: Secondary | ICD-10-CM | POA: Diagnosis not present

## 2018-01-02 DIAGNOSIS — J189 Pneumonia, unspecified organism: Secondary | ICD-10-CM | POA: Diagnosis not present

## 2018-01-02 DIAGNOSIS — Z23 Encounter for immunization: Secondary | ICD-10-CM | POA: Diagnosis not present

## 2018-01-02 DIAGNOSIS — R918 Other nonspecific abnormal finding of lung field: Secondary | ICD-10-CM | POA: Diagnosis not present

## 2018-01-02 DIAGNOSIS — Z6826 Body mass index (BMI) 26.0-26.9, adult: Secondary | ICD-10-CM | POA: Diagnosis not present

## 2018-01-05 ENCOUNTER — Other Ambulatory Visit: Payer: Self-pay | Admitting: Internal Medicine

## 2018-01-05 DIAGNOSIS — R918 Other nonspecific abnormal finding of lung field: Secondary | ICD-10-CM

## 2018-01-07 ENCOUNTER — Ambulatory Visit
Admission: RE | Admit: 2018-01-07 | Discharge: 2018-01-07 | Disposition: A | Discharge status: Home / Self Care | Payer: Medicare Other | Source: Ambulatory Visit | Attending: Internal Medicine | Admitting: Internal Medicine

## 2018-01-07 DIAGNOSIS — R918 Other nonspecific abnormal finding of lung field: Secondary | ICD-10-CM

## 2018-01-07 IMAGING — CT CT CHEST W/O CM
1 series · 15 of 34 positions shown, 19 images · non-contrast
Comparison: [DATE] CT

CLINICAL DATA: 85-year-old male for follow-up of bilateral lung
opacities, LEFT-greater-than-RIGHT.

EXAM:
CT CHEST WITHOUT CONTRAST
TECHNIQUE: Multidetector CT imaging of the chest was performed following the
standard protocol without IV contrast.

[Series 2: chest w/(date) · axial · 0.77mm/px · z∈[-366,-76]mm · 15 of 171 slices shown, 19 images]
[im 13/171  mediastinal]
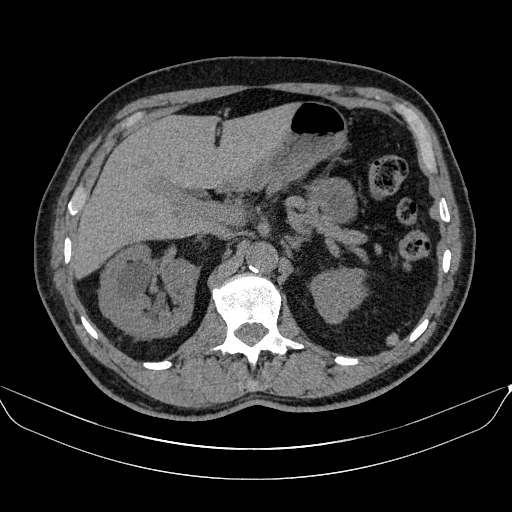
[im 13/171  lung]
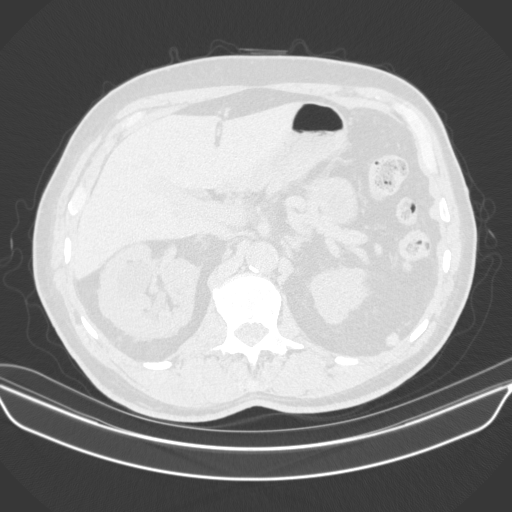
[im 26/171  lung]
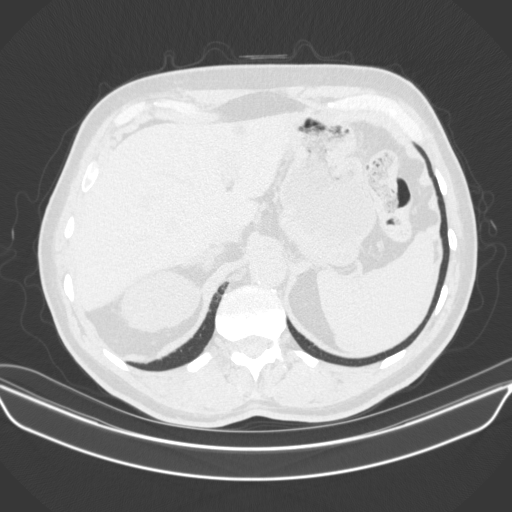
[im 35/171  lung]
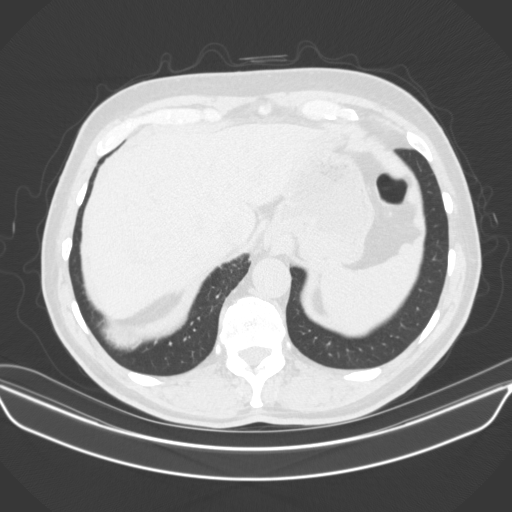
[im 45/171  lung]
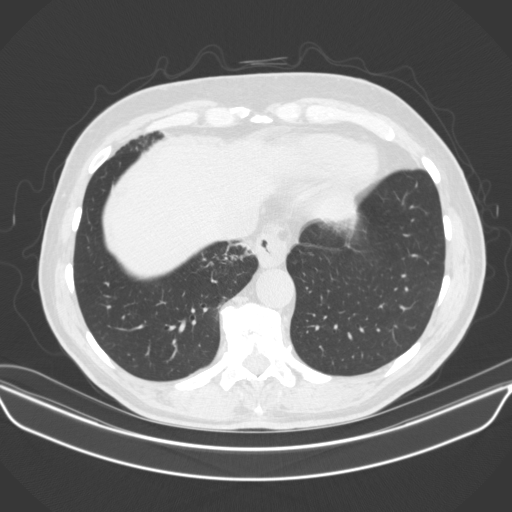
[im 57/171  mediastinal]
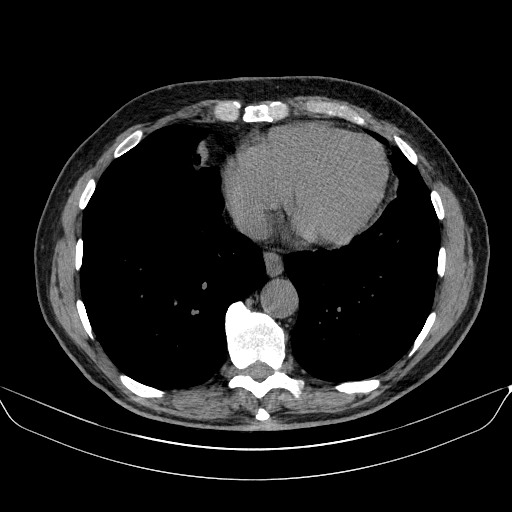
[im 57/171  lung]
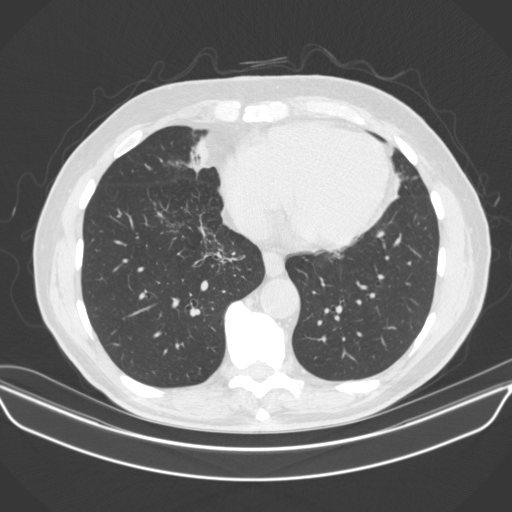
[im 69/171  lung]
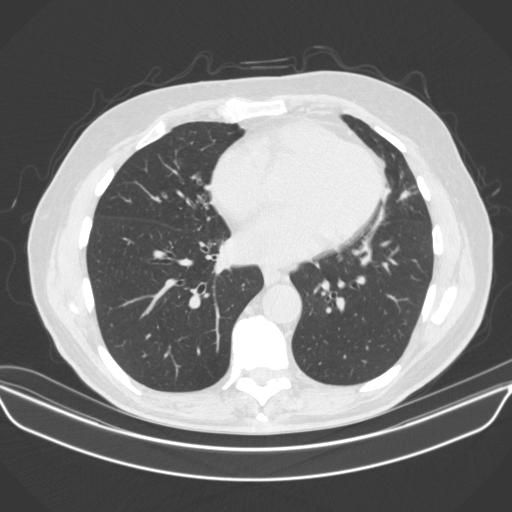
[im 76/171  lung]
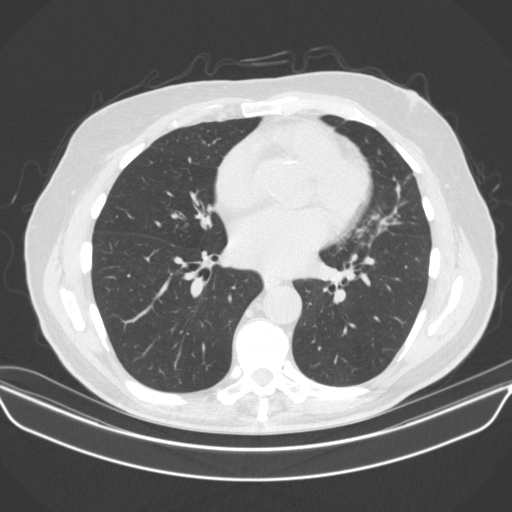
[im 89/171  lung]
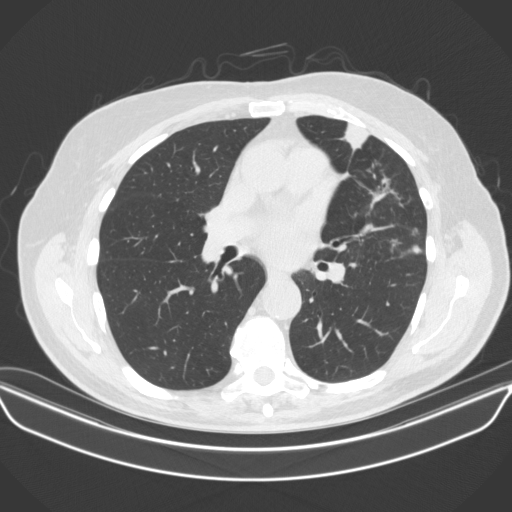
[im 95/171  mediastinal]
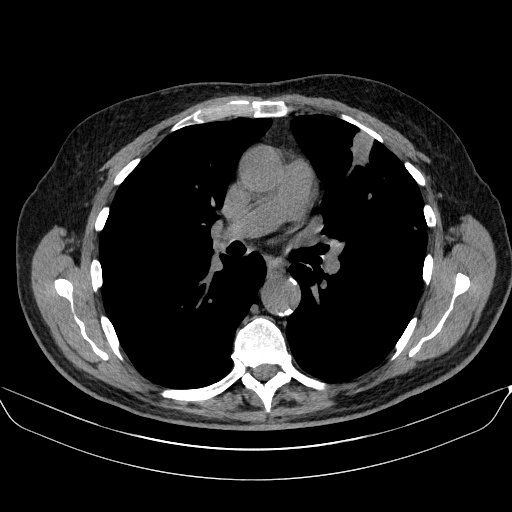
[im 95/171  lung]
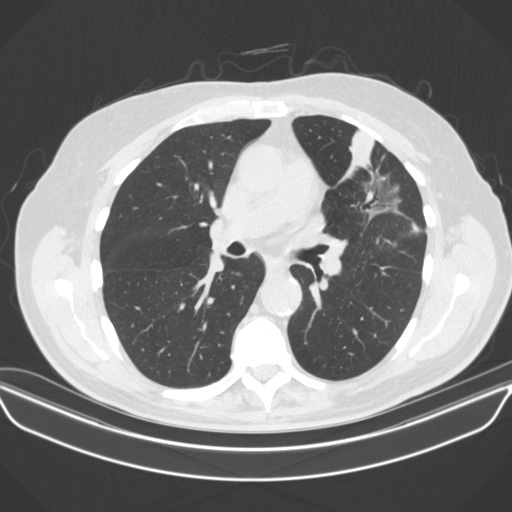
[im 103/171  lung]
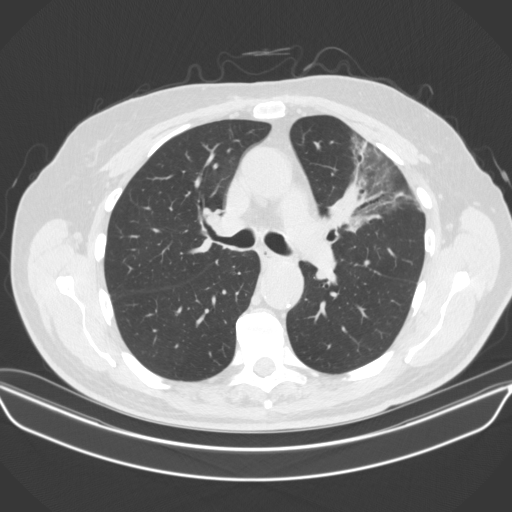
[im 114/171  lung]
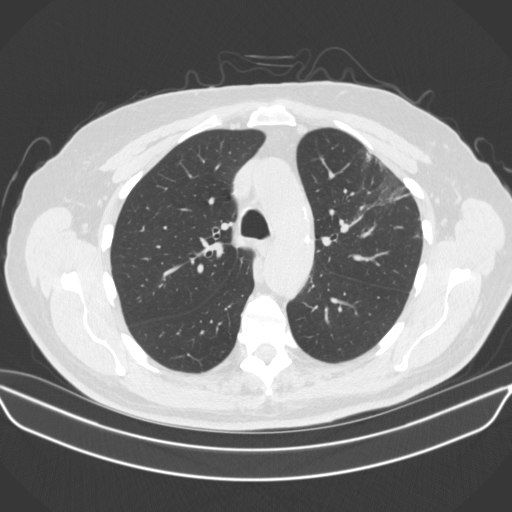
[im 126/171  lung]
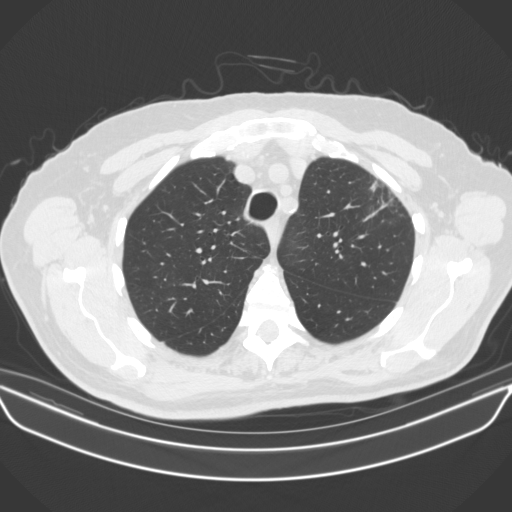
[im 137/171  mediastinal]
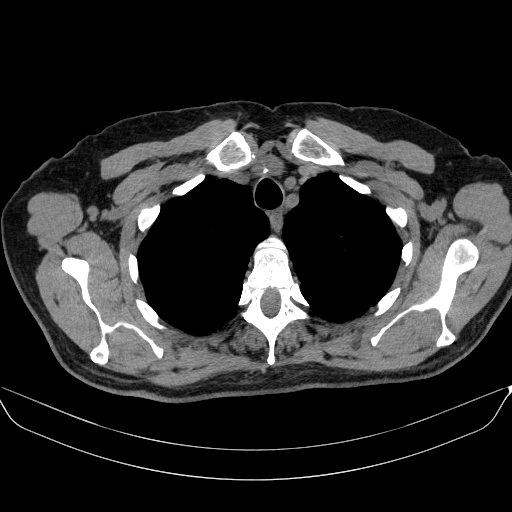
[im 137/171  lung]
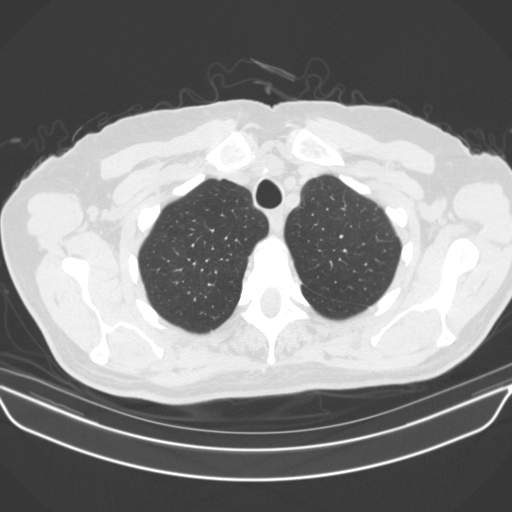
[im 145/171  lung]
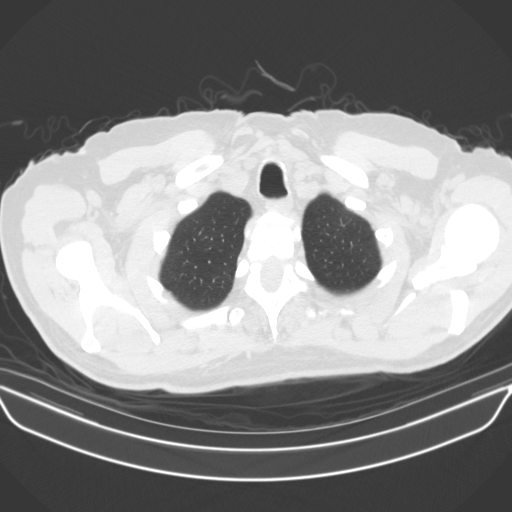
[im 158/171  lung]
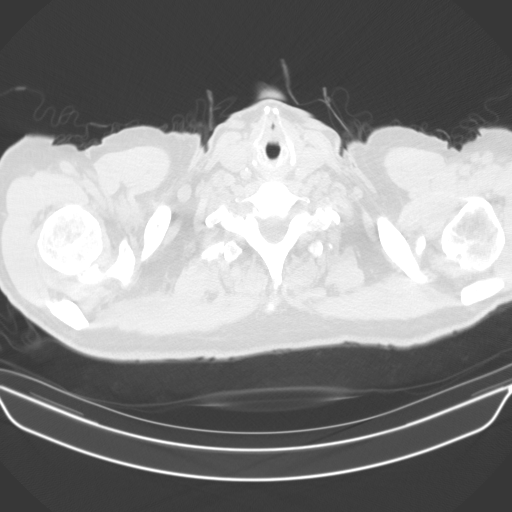

[15 of 34 positions shown; findings below may reference images not displayed]

FINDINGS: Cardiovascular: Normal heart size. Coronary artery and aortic
atherosclerotic calcifications noted. No thoracic aortic aneurysm or
pericardial effusion.

Mediastinum/Nodes: No enlarged mediastinal or axillary lymph nodes.
Thyroid gland, trachea, and esophagus demonstrate no significant
findings except for small hiatal hernia..

Lungs/Pleura: Near complete resolution of lingular
consolidation/airspace disease noted with small amount of residual
rounded atelectasis.

Decreased RIGHT middle lobe consolidation/atelectasis noted.

Improved mild tree-in-bud opacities within the MEDIAL RIGHT LOWER
lobe identified.

No new or enlarging pulmonary opacities are identified.

No pleural effusion or pneumothorax.

Upper Abdomen: No acute abnormality. Nonobstructing RIGHT renal
calculus and cysts again noted.

Musculoskeletal: No acute or suspicious bony abnormalities.
IMPRESSION: 1. Near complete resolution of lingular consolidation/airspace
disease with small amount of residual rounded atelectasis. Imaging
follow-up is likely no longer warranted.
2. Decreased RIGHT middle lobe consolidation/atelectasis and
improved mild RIGHT LOWER lobe tree-in-bud opacities..
3. Coronary artery and Aortic Atherosclerosis ([FT]-[FT]).

## 2018-01-19 DIAGNOSIS — E7849 Other hyperlipidemia: Secondary | ICD-10-CM | POA: Diagnosis not present

## 2018-01-19 DIAGNOSIS — R82998 Other abnormal findings in urine: Secondary | ICD-10-CM | POA: Diagnosis not present

## 2018-01-19 DIAGNOSIS — I10 Essential (primary) hypertension: Secondary | ICD-10-CM | POA: Diagnosis not present

## 2018-01-19 DIAGNOSIS — E038 Other specified hypothyroidism: Secondary | ICD-10-CM | POA: Diagnosis not present

## 2018-01-23 DIAGNOSIS — Z1212 Encounter for screening for malignant neoplasm of rectum: Secondary | ICD-10-CM | POA: Diagnosis not present

## 2018-01-27 DIAGNOSIS — E7849 Other hyperlipidemia: Secondary | ICD-10-CM | POA: Diagnosis not present

## 2018-01-27 DIAGNOSIS — Z0000 Encounter for general adult medical examination without abnormal findings: Secondary | ICD-10-CM | POA: Diagnosis not present

## 2018-01-27 DIAGNOSIS — H3530 Unspecified macular degeneration: Secondary | ICD-10-CM | POA: Diagnosis not present

## 2018-01-27 DIAGNOSIS — I10 Essential (primary) hypertension: Secondary | ICD-10-CM | POA: Diagnosis not present

## 2018-01-27 DIAGNOSIS — Z6826 Body mass index (BMI) 26.0-26.9, adult: Secondary | ICD-10-CM | POA: Diagnosis not present

## 2018-01-27 DIAGNOSIS — E663 Overweight: Secondary | ICD-10-CM | POA: Diagnosis not present

## 2018-01-27 DIAGNOSIS — F5104 Psychophysiologic insomnia: Secondary | ICD-10-CM | POA: Diagnosis not present

## 2018-01-27 DIAGNOSIS — Z1389 Encounter for screening for other disorder: Secondary | ICD-10-CM | POA: Diagnosis not present

## 2018-01-27 DIAGNOSIS — N401 Enlarged prostate with lower urinary tract symptoms: Secondary | ICD-10-CM | POA: Diagnosis not present

## 2018-01-27 DIAGNOSIS — E038 Other specified hypothyroidism: Secondary | ICD-10-CM | POA: Diagnosis not present

## 2018-01-27 DIAGNOSIS — M1990 Unspecified osteoarthritis, unspecified site: Secondary | ICD-10-CM | POA: Diagnosis not present

## 2018-05-25 DIAGNOSIS — H33302 Unspecified retinal break, left eye: Secondary | ICD-10-CM | POA: Diagnosis not present

## 2018-05-25 DIAGNOSIS — D3131 Benign neoplasm of right choroid: Secondary | ICD-10-CM | POA: Diagnosis not present

## 2018-05-31 NOTE — Progress Notes (Addendum)
[facility] Note  06/01/2018     CHIEF COMPLAINT Patient presents for Retina Evaluation   HISTORY OF PRESENT ILLNESS: Robert Holder is a 85 y.o. male who presents to the clinic today for:   HPI    Retina Evaluation    In both eyes.  This started 1 week ago.  Associated Symptoms Negative for Flashes, Pain, Trauma, Fever, Floaters, Redness, Scalp Tenderness, Weight Loss, Fatigue, Jaw Claudication, Photophobia, Distortion, Blind Spot, Glare and Shoulder/Hip pain.  Context:  distance vision and mid-range vision.  Treatments tried include laser and surgery.  Response to treatment was significant improvement.  I, the attending physician,  performed the HPI with the patient and updated documentation appropriately.          Comments    Referral of DR. Mccuen for retina eval/poss. Tear OS. Patient states he had 1 yr follow up and was dx with tear left eye, denies flashes,floaters and ocular pain. Pt reports he had cataract sx and laser to follow "several years ago".       Last edited by Bernarda Caffey, MD on 06/01/2018  9:48 AM. (History)    pt states he saw Dr. Ellie Lunch for routine eye exam, pt states he was not having any problems with his vision, pt has monovision after cataract sx, right eye for distance, left for near, pt also had a Yag procedure on both, pt states he has had dry mac degen for several years  Referring physician: Luberta Mutter, MD [place], [place] [address]  HISTORICAL INFORMATION:   Selected notes from the MEDICAL RECORD NUMBER Referred by Dr. Luberta Mutter for concern of chronic retinal tear OS / ARMD LEE: 03.06.20 (C. McCuen) [BCVA: OD: 20/20-1 OS:] Ocular Hx-nevus OD, non-exu ARMD OU, tear OS PMH-    CURRENT MEDICATIONS: Current Outpatient Medications (Ophthalmic Drugs)  Medication Sig  . prednisoLONE acetate (PRED FORTE) 1 % ophthalmic suspension Place 1 drop into the left eye 4 (four) times daily for 7 days.    No current facility-administered medications for this visit.  (Ophthalmic Drugs)   Current Outpatient Medications (Other)  Medication Sig  . glucosamine-chondroitin 500-400 MG tablet Take 1 tablet by mouth 3 (three) times daily.  Marland Kitchen levofloxacin (LEVAQUIN) 500 MG tablet TK 1 T PO QD  . losartan (COZAAR) 100 MG tablet   . Multiple Vitamins-Minerals (PRESERVISION AREDS 2 PO) Take by mouth.  . niacin (NIASPAN) 500 MG CR tablet Take 500 mg by mouth at bedtime.  . sildenafil (VIAGRA) 100 MG tablet   . simvastatin (ZOCOR) 20 MG tablet    No current facility-administered medications for this visit.  (Other)      REVIEW OF SYSTEMS: ROS    Positive for: Eyes   Negative for: Constitutional, Gastrointestinal, Neurological, Skin, Genitourinary, Musculoskeletal, HENT, Endocrine, Cardiovascular, Respiratory, Psychiatric, Allergic/Imm, Heme/Lymph   Last edited by Zenovia Jordan, LPN on 07/05/8339  9:62 AM. (History)       ALLERGIES Allergies  Allergen Reactions  . Zithromax [Azithromycin]     PAST MEDICAL HISTORY Past Medical History:  Diagnosis Date  . Cataract   . Hypertension    Past Surgical History:  Procedure Laterality Date  . CATARACT EXTRACTION    . EYE SURGERY    . NOSE SURGERY      FAMILY HISTORY History reviewed. No pertinent family history.  SOCIAL HISTORY Social History   Tobacco Use  . Smoking status: Never Smoker  . Smokeless tobacco: Never Used  Substance Use Topics  . Alcohol use: Yes    Alcohol/week: 1.0 standard drinks    Types: 1 Glasses of wine per week    Frequency: Never  . Drug use: Not on file         OPHTHALMIC EXAM:  Base Eye Exam    Visual Acuity (Snellen - Linear)      Right Left   Dist [place] 20/20 20/40 -1   Dist ph [place]  20/30       Tonometry (Tonopen, 8:52 AM)      Right Left   Pressure 12 12       Pupils      Dark Light Shape React APD   Right 3 2 Round Brisk None   Left 3 2 Round Brisk None       Visual Fields       Left Right    Full Full       Extraocular Movement      Right Left    Full, Ortho Full, Ortho       Neuro/Psych    Oriented x3:  Yes   Mood/Affect:  Normal       Dilation    Both eyes:  1.0% Mydriacyl, 2.5% Phenylephrine @ 8:52 AM        Slit Lamp and Fundus Exam    Slit Lamp Exam      Right Left   Lids/Lashes Dermatochalasis - upper lid, Meibomian gland dysfunction, Scurf Dermatochalasis - upper lid, Meibomian gland dysfunction, Scurf   Conjunctiva/Sclera inferior Conjunctivochalasis inferior Conjunctivochalasis   Cornea 1+ Punctate epithelial erosions 1+ Punctate epithelial erosions   Anterior Chamber deep, narrow temporal angle deep, narrow temporal angle   Iris Round and dilated Round and dilated   Lens PC IOL in good position with open PC PCIOL in good position with open PC -- small opening and thick    Vitreous Vitreous syneresis Vitreous syneresis, Posterior vitreous detachment       Fundus Exam      Right Left   Disc mild Peripapillary atrophy, Pink and Sharp Pink and Sharp, mild Peripapillary atrophy   C/D Ratio 0.4 0.3   Macula Blunted foveal reflex, mild nasal Epiretinal membrane, focal RPE atrophy and clumping, 1.5x2DD nevus along ST arcades, flat, no SRF or orange pigment Blunted foveal reflex, focal RPE disruption temporal to fovea, , No heme or edema   Vessels Vascular attenuation Vascular attenuation   Periphery Attached, 360 reticular degeneration, inferior paving stone degeneration Attached, horseshoe tear with surrounding SRF and pigmented demarcation line from 0330-0430          IMAGING AND PROCEDURES  Imaging and Procedures for _0 @  OCT, Retina - OU - Both Eyes       Right Eye Quality was good. Central Foveal Thickness: 247. Progression has no prior data. Findings include normal foveal contour, no IRF, no SRF (Focal drusenoid PED, mild ERM, mild drusen).   Left Eye Quality was good. Central Foveal Thickness: 245. Progression has no  prior data. Findings include normal foveal contour, no IRF, no SRF (Focal PED/drusen).   Notes *Images captured and stored on drive  Diagnosis / Impression:  Non-exu ARMD OU   Clinical management:  See below  Abbreviations: NFP - Normal foveal profile. CME - cystoid macular edema. PED - pigment epithelial detachment. IRF - intraretinal fluid. SRF - subretinal fluid. EZ - ellipsoid zone. ERM - epiretinal membrane. ORA - outer retinal atrophy. ORT - outer retinal tubulation. SRHM - subretinal  hyper-reflective material        Yag Capsulotomy - OS - Left Eye       Procedure note: YAG Capsulotomy, LEFT Eye  Informed consent obtained. Pre-op dilating drops (1% Topicamide and 2.5% Phenylephrine), and topical anesthesia given. Power: 6.4 mJ Shots: 40 Supplemental posterior capsulotomy performed without difficulty--opening enlarged. Patient tolerated procedure well. No complications. Rx pred forte 4 times a day for 7 days, then stop. Pt received written and verbal post laser education.                  ASSESSMENT/PLAN:    ICD-10-CM   1. Retinal tear of left eye H33.312 CANCELED: Repair Retinal Detach, Photocoag - OS - Left Eye  2. Left retinal detachment H33.22 CANCELED: Repair Retinal Detach, Photocoag - OS - Left Eye  3. Retinal edema H35.81 OCT, Retina - OU - Both Eyes  4. Intermediate stage nonexudative age-related macular degeneration of both eyes H35.3132   5. Choroidal nevus of right eye D31.31   6. Essential hypertension I10   7. Hypertensive retinopathy of both eyes H35.033   8. Pseudophakia of both eyes Z96.1   9. Left posterior capsular opacification H26.492 Yag Capsulotomy - OS - Left Eye    1-3. Retinal tear with focal retinal detachment, OS  - The incidence, risk factors, and natural history of retinal tear was discussed with patient.    - Potential treatment options including laser retinopexy and cryotherapy discussed with patient.  - tear located  from [phone] with surrounding SRF and thin demarcation line  - recommend laser retinopexy OS but capsulotomy edge impeding view and laser to peripheral  - recommend YAG OS today and then laser retinopexy OS on Wednesday  - f/u Wednesday for laser retinopexy OS  4. Age related macular degeneration, non-exudative, both eyes  - The incidence, anatomy, and pathology of dry AMD, risk of progression, and the AREDS and AREDS 2 study including smoking risks discussed with patient.  - Recommend amsler grid monitoring  5. Choroidal nevus OD  - 1.5x2 DD flat pigmented lesion   - no SRF or orange pigment  - monitor  6,7. Hypertensive retinopathy OU  - discussed importance of tight BP control  - monitor  8. Pseudophakia OU  - s/p CE/IOL OU by Dr. Ellie Lunch (OD: distance, OS: near)  - beautiful surgeries, doing well  - monitor  9. PCO OS  - open PC OU  - OS with thick PCO rim at capsulotomy edge impeding peripheral laser retinopexy  - recommend supplemental YAG cap OS today  - RBA of procedure discussed, questions answered  - informed consent obtained and signed  - see procedure note  - start PF QID x7 days   Ophthalmic Meds Ordered this visit:  Meds ordered this encounter  Medications  . prednisoLONE acetate (PRED FORTE) 1 % ophthalmic suspension    Sig: Place 1 drop into the left eye 4 (four) times daily for 7 days.    Dispense:  10 mL    Refill:  0       Return in about 2 weeks (around 06/15/2018) for POV.  There are no Patient Instructions on file for this visit.   Explained the diagnoses, plan, and follow up with the patient and they expressed understanding.  Patient expressed understanding of the importance of proper follow up care.   This document serves as a record of services personally performed by Gardiner Sleeper, MD, PhD. It was created on their behalf by Estill Bamberg  Owens Shark OA, an ophthalmic assistant. The creation of this record is the provider's dictation and/or activities  during the visit.    Electronically signed by: Ernest Mallick, OA  03.22.2020 10:33 PM    Gardiner Sleeper, M.D., Ph.D. Diseases & Surgery of the Retina and Vitreous [facility]  I have reviewed the above documentation for accuracy and completeness, and I agree with the above. Gardiner Sleeper, M.D., Ph.D. 06/01/18 10:33 PM    Abbreviations: M myopia (nearsighted); A astigmatism; H hyperopia (farsighted); P presbyopia; Mrx spectacle prescription;  CTL contact lenses; OD right eye; OS left eye; OU both eyes  XT exotropia; ET esotropia; PEK punctate epithelial keratitis; PEE punctate epithelial erosions; DES dry eye syndrome; MGD meibomian gland dysfunction; ATs artificial tears; PFAT's preservative free artificial tears; [place] nuclear sclerotic cataract; PSC posterior subcapsular cataract; ERM epi-retinal membrane; PVD posterior vitreous detachment; RD retinal detachment; DM diabetes mellitus; DR diabetic retinopathy; NPDR non-proliferative diabetic retinopathy; PDR proliferative diabetic retinopathy; CSME clinically significant macular edema; DME diabetic macular edema; dbh dot blot hemorrhages; CWS cotton wool spot; POAG primary open angle glaucoma; C/D cup-to-disc ratio; HVF humphrey visual field; GVF goldmann visual field; OCT optical coherence tomography; IOP intraocular pressure; BRVO Branch retinal vein occlusion; CRVO central retinal vein occlusion; CRAO central retinal artery occlusion; BRAO branch retinal artery occlusion; RT retinal tear; SB scleral buckle; PPV pars plana vitrectomy; VH Vitreous hemorrhage; PRP panretinal laser photocoagulation; IVK intravitreal kenalog; VMT vitreomacular traction; MH Macular hole;  NVD neovascularization of the disc; NVE neovascularization elsewhere; AREDS age related eye disease study; ARMD age related macular degeneration; POAG primary open angle glaucoma; EBMD epithelial/anterior basement membrane dystrophy; ACIOL anterior chamber  intraocular lens; IOL intraocular lens; PCIOL posterior chamber intraocular lens; Phaco/IOL phacoemulsification with intraocular lens placement; [place] photorefractive keratectomy; LASIK laser assisted in situ keratomileusis; HTN hypertension; DM diabetes mellitus; COPD chronic obstructive pulmonary disease

## 2018-06-01 ENCOUNTER — Ambulatory Visit (INDEPENDENT_AMBULATORY_CARE_PROVIDER_SITE_OTHER): Payer: Medicare Other | Admitting: Ophthalmology

## 2018-06-01 ENCOUNTER — Encounter (INDEPENDENT_AMBULATORY_CARE_PROVIDER_SITE_OTHER): Payer: Self-pay | Admitting: Ophthalmology

## 2018-06-01 DIAGNOSIS — H353132 Nonexudative age-related macular degeneration, bilateral, intermediate dry stage: Secondary | ICD-10-CM

## 2018-06-01 DIAGNOSIS — D3131 Benign neoplasm of right choroid: Secondary | ICD-10-CM

## 2018-06-01 DIAGNOSIS — H3581 Retinal edema: Secondary | ICD-10-CM

## 2018-06-01 DIAGNOSIS — H26492 Other secondary cataract, left eye: Secondary | ICD-10-CM | POA: Diagnosis not present

## 2018-06-01 DIAGNOSIS — I10 Essential (primary) hypertension: Secondary | ICD-10-CM

## 2018-06-01 DIAGNOSIS — H3322 Serous retinal detachment, left eye: Secondary | ICD-10-CM

## 2018-06-01 DIAGNOSIS — Z961 Presence of intraocular lens: Secondary | ICD-10-CM

## 2018-06-01 DIAGNOSIS — H35033 Hypertensive retinopathy, bilateral: Secondary | ICD-10-CM | POA: Diagnosis not present

## 2018-06-01 DIAGNOSIS — H33312 Horseshoe tear of retina without detachment, left eye: Secondary | ICD-10-CM

## 2018-06-01 MED ORDER — PREDNISOLONE ACETATE 1 % OP SUSP: 1.0000 [drp] | Freq: Four times a day (QID) | OPHTHALMIC | 0 refills | Status: AC

## 2018-06-02 NOTE — Progress Notes (Signed)
[facility] Note  06/03/2018     CHIEF COMPLAINT Patient presents for Retina Follow Up   HISTORY OF PRESENT ILLNESS: Robert Holder is a 85 y.o. male who presents to the clinic today for:   HPI    Retina Follow Up    Patient presents with  Retinal Break/Detachment.  In left eye.  This started 1 week ago.  Severity is mild.  Since onset it is stable.  I, the attending physician,  performed the HPI with the patient and updated documentation appropriately.          Comments    F/U retina tear OS, S/P yag os 05/31/2018. Patient states his vision is the same as last ov, his ready for tx today as indicated Monday.       Last edited by Bernarda Caffey, MD on 06/03/2018  2:18 PM. (History)    pt here for laser retinopexy OS today, pt states he had a headache after the yag procedure at last visit and he states he has some new floaters now as well  Referring physician: Luberta Mutter, MD [place], [place] [address]  HISTORICAL INFORMATION:   Selected notes from the MEDICAL RECORD NUMBER Referred by Dr. Luberta Mutter for concern of chronic retinal tear OS / ARMD LEE: 03.06.20 (C. McCuen) [BCVA: OD: 20/20-1 OS:] Ocular Hx-nevus OD, non-exu ARMD OU, tear OS PMH-    CURRENT MEDICATIONS: Current Outpatient Medications (Ophthalmic Drugs)  Medication Sig  . prednisoLONE acetate (PRED FORTE) 1 % ophthalmic suspension Place 1 drop into the left eye 4 (four) times daily for 7 days.   No current facility-administered medications for this visit.  (Ophthalmic Drugs)   Current Outpatient Medications (Other)  Medication Sig  . glucosamine-chondroitin 500-400 MG tablet Take 1 tablet by mouth 3 (three) times daily.  Marland Kitchen levofloxacin (LEVAQUIN) 500 MG tablet TK 1 T PO QD  . losartan (COZAAR) 100 MG tablet   . Multiple Vitamins-Minerals (PRESERVISION AREDS 2 PO) Take by mouth.  . niacin (NIASPAN) 500 MG CR tablet Take 500 mg by mouth at bedtime.  .  sildenafil (VIAGRA) 100 MG tablet   . simvastatin (ZOCOR) 20 MG tablet    No current facility-administered medications for this visit.  (Other)      REVIEW OF SYSTEMS: ROS    Positive for: Eyes   Negative for: Constitutional, Gastrointestinal, Neurological, Skin, Genitourinary, Musculoskeletal, HENT, Endocrine, Cardiovascular, Respiratory, Psychiatric, Allergic/Imm, Heme/Lymph   Last edited by Zenovia Jordan, LPN on 9/98/3382  5:05 AM. (History)       ALLERGIES Allergies  Allergen Reactions  . Zithromax [Azithromycin]     PAST MEDICAL HISTORY Past Medical History:  Diagnosis Date  . Cataract   . Hypertension    Past Surgical History:  Procedure Laterality Date  . CATARACT EXTRACTION    . EYE SURGERY    . NOSE SURGERY      FAMILY HISTORY History reviewed. No pertinent family history.  SOCIAL HISTORY Social History   Tobacco Use  . Smoking status: Never Smoker  . Smokeless tobacco: Never Used  Substance Use Topics  . Alcohol use: Yes    Alcohol/week: 1.0 standard drinks    Types: 1 Glasses of wine per week    Frequency: Never  . Drug use: Not on file         OPHTHALMIC EXAM:  Base Eye Exam    Visual Acuity (Snellen - Linear)      Right Left  Dist [place] 20/20 20/40 +1   Dist ph [place]  20/30       Tonometry (Tonopen, 8:12 AM)      Right Left   Pressure 12 12       Pupils      Dark Light Shape React APD   Right 3 2 Round Brisk None   Left 3 2 Round Brisk None       Visual Fields (Counting fingers)      Left Right    Full Full       Extraocular Movement      Right Left    Full, Ortho Full, Ortho       Neuro/Psych    Oriented x3:  Yes   Mood/Affect:  Normal       Dilation    Both eyes:  1.0% Mydriacyl, 2.5% Phenylephrine @ 8:13 AM        Slit Lamp and Fundus Exam    Slit Lamp Exam      Right Left   Lids/Lashes Dermatochalasis - upper lid, Meibomian gland dysfunction, Scurf Dermatochalasis - upper lid, Meibomian gland  dysfunction, Scurf   Conjunctiva/Sclera inferior Conjunctivochalasis inferior Conjunctivochalasis   Cornea 1+ Punctate epithelial erosions 1+ Punctate epithelial erosions   Anterior Chamber deep, narrow temporal angle deep, narrow temporal angle   Iris Round and dilated Round and dilated   Lens PC IOL in good position with open PC PCIOL in good position with open PC -- small opening and thick    Vitreous Vitreous syneresis Vitreous syneresis, Posterior vitreous detachment       Fundus Exam      Right Left   Disc mild Peripapillary atrophy, Pink and Sharp Pink and Sharp, mild Peripapillary atrophy   C/D Ratio 0.4 0.3   Macula Blunted foveal reflex, mild nasal Epiretinal membrane, focal RPE atrophy and clumping, 1.5x2DD nevus along ST arcades, flat, no SRF or orange pigment Blunted foveal reflex, focal RPE disruption temporal to fovea, , No heme or edema   Vessels Vascular attenuation Vascular attenuation   Periphery Attached, 360 reticular degeneration, inferior paving stone degeneration Attached, horseshoe tear with surrounding SRF and pigmented demarcation line from 0330-0430          IMAGING AND PROCEDURES  Imaging and Procedures for @TODAY @  OCT, Retina - OU - Both Eyes       Right Eye Quality was good. Central Foveal Thickness: 247. Progression has been stable. Findings include normal foveal contour, no IRF, no SRF (Focal drusenoid PED, mild ERM, mild drusen).   Left Eye Quality was good. Central Foveal Thickness: 245. Progression has been stable. Findings include normal foveal contour, no IRF, no SRF (Focal PED/drusen).   Notes *Images captured and stored on drive  Diagnosis / Impression:  Non-exu ARMD OU   Clinical management:  See below  Abbreviations: NFP - Normal foveal profile. CME - cystoid macular edema. PED - pigment epithelial detachment. IRF - intraretinal fluid. SRF - subretinal fluid. EZ - ellipsoid zone. ERM - epiretinal membrane. ORA - outer retinal  atrophy. ORT - outer retinal tubulation. SRHM - subretinal hyper-reflective material        Repair Retinal Detach, Photocoag - OS - Left Eye       LASER PROCEDURE NOTE  Procedure:  Barrier laser retinopexy using slit lamp laser, LEFT eye   Diagnosis:   Retinal tear w/ surrounding SRF / focal retinal detachment, LEFT eye  Flap tear at 4 o'clock anterior to equator w/ SRF and demarcation line spanning 330-430   Surgeon: Bernarda Caffey, MD, PhD  Anesthesia: Topical  Informed consent obtained, operative eye marked, and time out performed prior to initiation of laser.   Laser settings:  Lumenis Smart532 laser, slit lamp Lens: Mainster PRP 165 Power: 320 mW Spot size: 200 microns Duration: 30 msec  # spots: 179  Placement of laser: Using a Mainster PRP 165 contact lens at the slit lamp, laser was placed in three confluent rows around flap tear at 4 o'clock anterior to equator w/ SRF and demarcation line spanning 330-430. The anterior portion of laser was completed via laser indirect ophthalmoscopy: 562 spots, 440 mW power, 100 ms duratoin.  Complications: None.  Notes: Difficult laser due to pt's difficulty with maintaining eye position and because of difficulties focusing laser through IOL and capsulotomy edges  Patient tolerated the procedure well and received written and verbal post-procedure care information/education.                  ASSESSMENT/PLAN:    ICD-10-CM   1. Retinal tear of left eye H33.312 Repair Retinal Detach, Photocoag - OS - Left Eye  2. Left retinal detachment H33.22 Repair Retinal Detach, Photocoag - OS - Left Eye  3. Retinal edema H35.81 OCT, Retina - OU - Both Eyes  4. Intermediate stage nonexudative age-related macular degeneration of both eyes H35.3132   5. Choroidal nevus of right eye D31.31   6. Essential hypertension I10   7. Hypertensive retinopathy of both eyes H35.033   8. Pseudophakia of both eyes Z96.1     1-3.  Retinal tear with focal retinal detachment, OS  - The incidence, risk factors, and natural history of retinal tear was discussed with patient.    - Potential treatment options including laser retinopexy and cryotherapy discussed with patient.  - tear located from [phone] with surrounding SRF and thin demarcation line  - recommend laser retinopexy OS today, (03.25.20)  - pt wishes to proceed  - RBA of procedure discussed, questions answered  - informed consent obtained and signed  - see procedure note  - PF QID OS x7 days   4. Age related macular degeneration, non-exudative, both eyes  - The incidence, anatomy, and pathology of dry AMD, risk of progression, and the AREDS and AREDS 2 study including smoking risks discussed with patient.  - Recommend amsler grid monitoring  5. Choroidal nevus OD  - 1.5x2 DD flat pigmented lesion   - no SRF or orange pigment  - monitor  6,7. Hypertensive retinopathy OU  - discussed importance of tight BP control  - monitor  8. Pseudophakia OU  - s/p CE/IOL OU by Dr. Ellie Lunch (OD: distance, OS: near)  - beautiful surgeries, doing well  - open PC OU  - monitor                 Ophthalmic Meds Ordered this visit:  No orders of the defined types were placed in this encounter.      Return for 2-3 wks POV -- overbook okay.  There are no Patient Instructions on file for this visit.   Explained the diagnoses, plan, and follow up with the patient and they expressed understanding.  Patient expressed understanding of the importance of proper follow up care.   This document serves as a record of services personally performed by Gardiner Sleeper, MD, PhD. It was created on their behalf by Ernest Mallick, OA, an  ophthalmic assistant. The creation of this record is the provider's dictation and/or activities during the visit.    Electronically signed by: Ernest Mallick, OA  03.24.2020 2:18 PM    Gardiner Sleeper, M.D., Ph.D. Diseases & Surgery of the Retina  and Vitreous [facility]  I have reviewed the above documentation for accuracy and completeness, and I agree with the above. Gardiner Sleeper, M.D., Ph.D. 06/03/18 2:18 PM     Abbreviations: M myopia (nearsighted); A astigmatism; H hyperopia (farsighted); P presbyopia; Mrx spectacle prescription;  CTL contact lenses; OD right eye; OS left eye; OU both eyes  XT exotropia; ET esotropia; PEK punctate epithelial keratitis; PEE punctate epithelial erosions; DES dry eye syndrome; MGD meibomian gland dysfunction; ATs artificial tears; PFAT's preservative free artificial tears; [place] nuclear sclerotic cataract; PSC posterior subcapsular cataract; ERM epi-retinal membrane; PVD posterior vitreous detachment; RD retinal detachment; DM diabetes mellitus; DR diabetic retinopathy; NPDR non-proliferative diabetic retinopathy; PDR proliferative diabetic retinopathy; CSME clinically significant macular edema; DME diabetic macular edema; dbh dot blot hemorrhages; CWS cotton wool spot; POAG primary open angle glaucoma; C/D cup-to-disc ratio; HVF humphrey visual field; GVF goldmann visual field; OCT optical coherence tomography; IOP intraocular pressure; BRVO Branch retinal vein occlusion; CRVO central retinal vein occlusion; CRAO central retinal artery occlusion; BRAO branch retinal artery occlusion; RT retinal tear; SB scleral buckle; PPV pars plana vitrectomy; VH Vitreous hemorrhage; PRP panretinal laser photocoagulation; IVK intravitreal kenalog; VMT vitreomacular traction; MH Macular hole;  NVD neovascularization of the disc; NVE neovascularization elsewhere; AREDS age related eye disease study; ARMD age related macular degeneration; POAG primary open angle glaucoma; EBMD epithelial/anterior basement membrane dystrophy; ACIOL anterior chamber intraocular lens; IOL intraocular lens; PCIOL posterior chamber intraocular lens; Phaco/IOL phacoemulsification with intraocular lens placement; [place]  photorefractive keratectomy; LASIK laser assisted in situ keratomileusis; HTN hypertension; DM diabetes mellitus; COPD chronic obstructive pulmonary disease

## 2018-06-03 ENCOUNTER — Ambulatory Visit (INDEPENDENT_AMBULATORY_CARE_PROVIDER_SITE_OTHER): Payer: Medicare Other | Admitting: Ophthalmology

## 2018-06-03 ENCOUNTER — Other Ambulatory Visit: Payer: Self-pay

## 2018-06-03 ENCOUNTER — Encounter (INDEPENDENT_AMBULATORY_CARE_PROVIDER_SITE_OTHER): Payer: Self-pay | Admitting: Ophthalmology

## 2018-06-03 DIAGNOSIS — H3322 Serous retinal detachment, left eye: Secondary | ICD-10-CM

## 2018-06-03 DIAGNOSIS — Z961 Presence of intraocular lens: Secondary | ICD-10-CM

## 2018-06-03 DIAGNOSIS — H33312 Horseshoe tear of retina without detachment, left eye: Secondary | ICD-10-CM | POA: Diagnosis not present

## 2018-06-03 DIAGNOSIS — H353132 Nonexudative age-related macular degeneration, bilateral, intermediate dry stage: Secondary | ICD-10-CM

## 2018-06-03 DIAGNOSIS — H3581 Retinal edema: Secondary | ICD-10-CM | POA: Diagnosis not present

## 2018-06-03 DIAGNOSIS — I10 Essential (primary) hypertension: Secondary | ICD-10-CM

## 2018-06-03 DIAGNOSIS — H35033 Hypertensive retinopathy, bilateral: Secondary | ICD-10-CM

## 2018-06-03 DIAGNOSIS — D3131 Benign neoplasm of right choroid: Secondary | ICD-10-CM

## 2018-06-15 NOTE — Progress Notes (Signed)
[facility] Note  06/17/2018     CHIEF COMPLAINT Patient presents for Retina Evaluation   HISTORY OF PRESENT ILLNESS: Robert Holder is a 85 y.o. male who presents to the clinic today for:   HPI    Retina Evaluation    In left eye.  This started weeks ago.  Duration of weeks.  Context:  distance vision.  I, the attending physician,  performed the HPI with the patient and updated documentation appropriately.          Comments    Patient states vision is staying about the same.  Patient denies eye pain or discomfort.  Patient denies new or worsening floaters or fol OU.       Last edited by Bernarda Caffey, MD on 06/17/2018  8:15 AM. (History)      Referring physician: Shon Baton, MD [place], [place] [address]  HISTORICAL INFORMATION:   Selected notes from the MEDICAL RECORD NUMBER Referred by Dr. Luberta Mutter for concern of chronic retinal tear OS / ARMD LEE: 03.06.20 (C. McCuen) [BCVA: OD: 20/20-1 OS:] Ocular Hx-nevus OD, non-exu ARMD OU, tear OS PMH-    CURRENT MEDICATIONS: No current outpatient medications on file. (Ophthalmic Drugs)   No current facility-administered medications for this visit.  (Ophthalmic Drugs)   Current Outpatient Medications (Other)  Medication Sig  . glucosamine-chondroitin 500-400 MG tablet Take 1 tablet by mouth 3 (three) times daily.  Marland Kitchen levofloxacin (LEVAQUIN) 500 MG tablet TK 1 T PO QD  . losartan (COZAAR) 100 MG tablet   . Multiple Vitamins-Minerals (PRESERVISION AREDS 2 PO) Take by mouth.  . niacin (NIASPAN) 500 MG CR tablet Take 500 mg by mouth at bedtime.  . sildenafil (VIAGRA) 100 MG tablet   . simvastatin (ZOCOR) 20 MG tablet    No current facility-administered medications for this visit.  (Other)      REVIEW OF SYSTEMS: ROS    Positive for: Eyes   Negative for: Constitutional, Gastrointestinal, Neurological, Skin, Genitourinary, Musculoskeletal, HENT, Endocrine, Cardiovascular,  Respiratory, Psychiatric, Allergic/Imm, Heme/Lymph   Last edited by Doneen Poisson on 06/17/2018  7:59 AM. (History)       ALLERGIES Allergies  Allergen Reactions  . Zithromax [Azithromycin]     PAST MEDICAL HISTORY Past Medical History:  Diagnosis Date  . Cataract   . Hypertension    Past Surgical History:  Procedure Laterality Date  . CATARACT EXTRACTION    . EYE SURGERY    . NOSE SURGERY      FAMILY HISTORY History reviewed. No pertinent family history.  SOCIAL HISTORY Social History   Tobacco Use  . Smoking status: Never Smoker  . Smokeless tobacco: Never Used  Substance Use Topics  . Alcohol use: Yes    Alcohol/week: 1.0 standard drinks    Types: 1 Glasses of wine per week    Frequency: Never  . Drug use: Not on file         OPHTHALMIC EXAM:  Base Eye Exam    Visual Acuity (Snellen - Linear)      Right Left   Dist [place] 20/20 -1 20/40 -1   Dist ph [place]  20/25 -3       Tonometry (Tonopen, 8:04 AM)      Right Left   Pressure 18 18       Pupils      Dark Light Shape React APD   Right 3 2 Round Minimal 0   Left 3  2 Round Minimal 0       Extraocular Movement      Right Left    Full Full       Neuro/Psych    Oriented x3:  Yes   Mood/Affect:  Normal       Dilation    Left eye:  1.0% Mydriacyl, 2.5% Phenylephrine @ 8:07 AM        Slit Lamp and Fundus Exam    Slit Lamp Exam      Right Left   Lids/Lashes Dermatochalasis - upper lid, Meibomian gland dysfunction, Scurf Dermatochalasis - upper lid, Meibomian gland dysfunction, Scurf   Conjunctiva/Sclera inferior Conjunctivochalasis inferior Conjunctivochalasis   Cornea 1+ Punctate epithelial erosions 1+ Punctate epithelial erosions, decreased TBUT, tear film debris   Anterior Chamber deep, narrow temporal angle deep, narrow temporal angle   Iris Round and dilated Round and dilated   Lens PC IOL in good position with open PC PCIOL in good position with open PC -- small opening and thick     Vitreous Vitreous syneresis Vitreous syneresis, Posterior vitreous detachment       Fundus Exam      Right Left   Disc  Pink and Sharp, mild Peripapillary atrophy   C/D Ratio 0.4 0.3   Macula  Blunted foveal reflex, focal RPE disruption temporal to fovea, , No heme or edema   Vessels  Vascular attenuation   Periphery  Attached, horseshoe tear with surrounding SRF and pigmented demarcation line from 0330-0430 -- good laser surrounding        Refraction    Manifest Refraction      Sphere Cylinder Dist [place]   Right -0.25 Sphere 20/20   Left -0.75 Sphere 20/25-          IMAGING AND PROCEDURES  Imaging and Procedures for @TODAY @  OCT, Retina - OU - Both Eyes       Right Eye Quality was good. Central Foveal Thickness: 248. Progression has been stable. Findings include normal foveal contour, no IRF, no SRF (Focal drusenoid PED, mild ERM, mild drusen).   Left Eye Quality was good. Central Foveal Thickness: 249. Progression has been stable. Findings include normal foveal contour, no IRF, no SRF, retinal drusen  (Focal PED/drusen).   Notes *Images captured and stored on drive  Diagnosis / Impression:  Non-exu ARMD OU   Clinical management:  See below  Abbreviations: NFP - Normal foveal profile. CME - cystoid macular edema. PED - pigment epithelial detachment. IRF - intraretinal fluid. SRF - subretinal fluid. EZ - ellipsoid zone. ERM - epiretinal membrane. ORA - outer retinal atrophy. ORT - outer retinal tubulation. SRHM - subretinal hyper-reflective material                 ASSESSMENT/PLAN:    ICD-10-CM   1. Retinal tear of left eye H33.312   2. Left retinal detachment H33.22   3. Retinal edema H35.81 OCT, Retina - OU - Both Eyes  4. Intermediate stage nonexudative age-related macular degeneration of both eyes H35.3132   5. Choroidal nevus of right eye D31.31   6. Essential hypertension I10   7. Hypertensive retinopathy of both eyes H35.033   8. Pseudophakia of  both eyes Z96.1     1-3. Retinal tear with focal retinal detachment, OS  - tear located from 0330-0430 with surrounding SRF and thin pigmented demarcation line  - s/p laser retinopexy OS (03.25.20) -- good laser surrounding  - completed PF QID OS x7 days  - f/u  2-3 months, sooner prn  4. Age related macular degeneration, non-exudative, both eyes  - The incidence, anatomy, and pathology of dry AMD, risk of progression, and the AREDS and AREDS 2 study including smoking risks discussed with patient.  - Recommend amsler grid monitoring  5. Choroidal nevus OD  - 1.5x2 DD flat pigmented lesion   - no SRF or orange pigment  - monitor  6,7. Hypertensive retinopathy OU  - discussed importance of tight BP control  - monitor  8. Pseudophakia OU  - s/p CE/IOL OU by Dr. Ellie Lunch (OD: distance, OS: near)  - beautiful surgeries, doing well  - open PC OU  - monitor                 Ophthalmic Meds Ordered this visit:  No orders of the defined types were placed in this encounter.      Return for f/u 2-3 months, retinal tear OS, DFE, OCT.  There are no Patient Instructions on file for this visit.   Explained the diagnoses, plan, and follow up with the patient and they expressed understanding.  Patient expressed understanding of the importance of proper follow up care.   This document serves as a record of services personally performed by Gardiner Sleeper, MD, PhD. It was created on their behalf by Ernest Mallick, OA, an ophthalmic assistant. The creation of this record is the provider's dictation and/or activities during the visit.    Electronically signed by: Ernest Mallick, OA  04.06.2020 11:41 AM     Gardiner Sleeper, M.D., Ph.D. Diseases & Surgery of the Retina and Vitreous [facility]  I have reviewed the above documentation for accuracy and completeness, and I agree with the above. Gardiner Sleeper, M.D., Ph.D. 06/17/18 11:42 AM     Abbreviations: M myopia  (nearsighted); A astigmatism; H hyperopia (farsighted); P presbyopia; Mrx spectacle prescription;  CTL contact lenses; OD right eye; OS left eye; OU both eyes  XT exotropia; ET esotropia; PEK punctate epithelial keratitis; PEE punctate epithelial erosions; DES dry eye syndrome; MGD meibomian gland dysfunction; ATs artificial tears; PFAT's preservative free artificial tears; [place] nuclear sclerotic cataract; PSC posterior subcapsular cataract; ERM epi-retinal membrane; PVD posterior vitreous detachment; RD retinal detachment; DM diabetes mellitus; DR diabetic retinopathy; NPDR non-proliferative diabetic retinopathy; PDR proliferative diabetic retinopathy; CSME clinically significant macular edema; DME diabetic macular edema; dbh dot blot hemorrhages; CWS cotton wool spot; POAG primary open angle glaucoma; C/D cup-to-disc ratio; HVF humphrey visual field; GVF goldmann visual field; OCT optical coherence tomography; IOP intraocular pressure; BRVO Branch retinal vein occlusion; CRVO central retinal vein occlusion; CRAO central retinal artery occlusion; BRAO branch retinal artery occlusion; RT retinal tear; SB scleral buckle; PPV pars plana vitrectomy; VH Vitreous hemorrhage; PRP panretinal laser photocoagulation; IVK intravitreal kenalog; VMT vitreomacular traction; MH Macular hole;  NVD neovascularization of the disc; NVE neovascularization elsewhere; AREDS age related eye disease study; ARMD age related macular degeneration; POAG primary open angle glaucoma; EBMD epithelial/anterior basement membrane dystrophy; ACIOL anterior chamber intraocular lens; IOL intraocular lens; PCIOL posterior chamber intraocular lens; Phaco/IOL phacoemulsification with intraocular lens placement; [place] photorefractive keratectomy; LASIK laser assisted in situ keratomileusis; HTN hypertension; DM diabetes mellitus; COPD chronic obstructive pulmonary disease

## 2018-06-17 ENCOUNTER — Other Ambulatory Visit: Payer: Self-pay

## 2018-06-17 ENCOUNTER — Encounter (INDEPENDENT_AMBULATORY_CARE_PROVIDER_SITE_OTHER): Payer: Self-pay | Admitting: Ophthalmology

## 2018-06-17 ENCOUNTER — Ambulatory Visit (INDEPENDENT_AMBULATORY_CARE_PROVIDER_SITE_OTHER): Payer: Medicare Other | Admitting: Ophthalmology

## 2018-06-17 DIAGNOSIS — H3322 Serous retinal detachment, left eye: Secondary | ICD-10-CM | POA: Diagnosis not present

## 2018-06-17 DIAGNOSIS — H3581 Retinal edema: Secondary | ICD-10-CM | POA: Diagnosis not present

## 2018-06-17 DIAGNOSIS — H35033 Hypertensive retinopathy, bilateral: Secondary | ICD-10-CM

## 2018-06-17 DIAGNOSIS — I10 Essential (primary) hypertension: Secondary | ICD-10-CM

## 2018-06-17 DIAGNOSIS — Z961 Presence of intraocular lens: Secondary | ICD-10-CM

## 2018-06-17 DIAGNOSIS — H353132 Nonexudative age-related macular degeneration, bilateral, intermediate dry stage: Secondary | ICD-10-CM

## 2018-06-17 DIAGNOSIS — D3131 Benign neoplasm of right choroid: Secondary | ICD-10-CM | POA: Diagnosis not present

## 2018-06-17 DIAGNOSIS — H33312 Horseshoe tear of retina without detachment, left eye: Secondary | ICD-10-CM

## 2018-08-04 DIAGNOSIS — E663 Overweight: Secondary | ICD-10-CM | POA: Diagnosis not present

## 2018-08-04 DIAGNOSIS — E039 Hypothyroidism, unspecified: Secondary | ICD-10-CM | POA: Diagnosis not present

## 2018-08-04 DIAGNOSIS — E871 Hypo-osmolality and hyponatremia: Secondary | ICD-10-CM | POA: Diagnosis not present

## 2018-08-04 DIAGNOSIS — E7849 Other hyperlipidemia: Secondary | ICD-10-CM | POA: Diagnosis not present

## 2018-08-04 DIAGNOSIS — E038 Other specified hypothyroidism: Secondary | ICD-10-CM | POA: Diagnosis not present

## 2018-08-04 DIAGNOSIS — I10 Essential (primary) hypertension: Secondary | ICD-10-CM | POA: Diagnosis not present

## 2018-08-04 DIAGNOSIS — H3530 Unspecified macular degeneration: Secondary | ICD-10-CM | POA: Diagnosis not present

## 2018-08-04 DIAGNOSIS — I700 Atherosclerosis of aorta: Secondary | ICD-10-CM | POA: Diagnosis not present

## 2018-08-04 DIAGNOSIS — R918 Other nonspecific abnormal finding of lung field: Secondary | ICD-10-CM | POA: Diagnosis not present

## 2018-08-04 DIAGNOSIS — M1990 Unspecified osteoarthritis, unspecified site: Secondary | ICD-10-CM | POA: Diagnosis not present

## 2018-08-31 NOTE — Progress Notes (Signed)
[facility] Note  09/01/2018     CHIEF COMPLAINT Patient presents for Retina Evaluation   HISTORY OF PRESENT ILLNESS: Robert Holder is a 85 y.o. male who presents to the clinic today for:   HPI    Retina Evaluation    In left eye.  This started weeks ago.  Duration of weeks.  Context:  distance vision and near vision.  I, the attending physician,  performed the HPI with the patient and updated documentation appropriately.          Comments    Patient states his vision is stable.  Patient denies eye pain or discomfort and denies any new or worsening floaters or fol OU.       Last edited by Bernarda Caffey, MD on 09/01/2018  8:18 AM. (History)    pt states there has been no change in his vision since last visit and he did not have any problems after the laser procedures  Referring physician: Luberta Mutter, MD [place],  [place] [id number]  HISTORICAL INFORMATION:   Selected notes from the MEDICAL RECORD NUMBER Referred by Dr. Luberta Mutter for concern of chronic retinal tear OS / ARMD LEE: 03.06.20 (C. McCuen) [BCVA: OD: 20/20-1 OS:] Ocular Hx-nevus OD, non-exu ARMD OU, tear OS PMH-    CURRENT MEDICATIONS: No current outpatient medications on file. (Ophthalmic Drugs)   No current facility-administered medications for this visit.  (Ophthalmic Drugs)   Current Outpatient Medications (Other)  Medication Sig  . glucosamine-chondroitin 500-400 MG tablet Take 1 tablet by mouth 3 (three) times daily.  Marland Kitchen levofloxacin (LEVAQUIN) 500 MG tablet TK 1 T PO QD  . losartan (COZAAR) 100 MG tablet   . Multiple Vitamins-Minerals (PRESERVISION AREDS 2 PO) Take by mouth.  . niacin (NIASPAN) 500 MG CR tablet Take 500 mg by mouth at bedtime.  . sildenafil (VIAGRA) 100 MG tablet   . simvastatin (ZOCOR) 20 MG tablet    No current facility-administered medications for this visit.  (Other)      REVIEW OF SYSTEMS: ROS    Positive for: Eyes    Negative for: Constitutional, Gastrointestinal, Neurological, Skin, Genitourinary, Musculoskeletal, HENT, Endocrine, Cardiovascular, Respiratory, Psychiatric, Allergic/Imm, Heme/Lymph   Last edited by Doneen Poisson on 09/01/2018  7:57 AM. (History)       ALLERGIES Allergies  Allergen Reactions  . Zithromax [Azithromycin]     PAST MEDICAL HISTORY Past Medical History:  Diagnosis Date  . Cataract   . Hypertension    Past Surgical History:  Procedure Laterality Date  . CATARACT EXTRACTION    . EYE SURGERY    . NOSE SURGERY      FAMILY HISTORY History reviewed. No pertinent family history.  SOCIAL HISTORY Social History   Tobacco Use  . Smoking status: Never Smoker  . Smokeless tobacco: Never Used  Substance Use Topics  . Alcohol use: Yes    Alcohol/week: 1.0 standard drinks    Types: 1 Glasses of wine per week    Frequency: Never  . Drug use: Not on file         OPHTHALMIC EXAM:  Base Eye Exam    Visual Acuity (Snellen - Linear)      Right Left   Dist [place] 20/20 -2 20/40   Dist ph cc  20/25 -2       Tonometry (Tonopen, 7:57 AM)      Right Left   Pressure 22 20  Pupils      Dark Light Shape React APD   Right 2 1 Round Minimal 0   Left 2 1 Round Minimal 0       Extraocular Movement      Right Left    Full Full       Neuro/Psych    Oriented x3: Yes   Mood/Affect: Normal       Dilation    Both eyes: 1.0% Mydriacyl, 2.5% Phenylephrine @ 7:57 AM        Slit Lamp and Fundus Exam    Slit Lamp Exam      Right Left   Lids/Lashes Dermatochalasis - upper lid, Meibomian gland dysfunction Dermatochalasis - upper lid, Meibomian gland dysfunction, Scurf, Telangiectasia   Conjunctiva/Sclera inferior Conjunctivochalasis inferior Conjunctivochalasis   Cornea 2-3+ inferior Punctate epithelial erosions 2+ inferior Punctate epithelial erosions   Anterior Chamber Deep and quiet Deep and quiet   Iris Round and dilated Round and dilated   Lens PC  IOL in good position with open PC PCIOL in good position with open PC -- small opening and thick    Vitreous Vitreous syneresis Vitreous syneresis, Posterior vitreous detachment       Fundus Exam      Right Left   Disc Trace pallor, sharp rim Trace pallor, sharp rim   C/D Ratio 0.2 0.3   Macula Blunted foveal reflex, mild nasal Epiretinal membrane, focal RPE atrophy and clumping, 1.5x2DD nevus along ST arcades, flat, no SRF or orange pigment -- all stable from prior Blunted foveal reflex, focal RPE disruption temporal to fovea, No heme or edema   Vessels Vascular attenuation Vascular attenuation   Periphery Attached, 360 reticular degeneration, inferior paving stone degeneration Attached, horseshoe tear with surrounding SRF and pigmented demarcation line from 0330-0430 -- good laser surrounding, scattered reticular degeneration, inferior pavingstone          IMAGING AND PROCEDURES  Imaging and Procedures for @TODAY @  OCT, Retina - OU - Both Eyes       Right Eye Quality was good. Central Foveal Thickness: 247. Progression has been stable. Findings include normal foveal contour, no IRF, no SRF (Focal drusenoid PED, mild ERM, mild drusen).   Left Eye Quality was good. Central Foveal Thickness: 252. Progression has been stable. Findings include normal foveal contour, no IRF, no SRF, retinal drusen  (Focal PED/drusen).   Notes *Images captured and stored on drive  Diagnosis / Impression:  Non-exu ARMD OU   Clinical management:  See below  Abbreviations: NFP - Normal foveal profile. CME - cystoid macular edema. PED - pigment epithelial detachment. IRF - intraretinal fluid. SRF - subretinal fluid. EZ - ellipsoid zone. ERM - epiretinal membrane. ORA - outer retinal atrophy. ORT - outer retinal tubulation. SRHM - subretinal hyper-reflective material                 ASSESSMENT/PLAN:    ICD-10-CM   1. Retinal tear of left eye  H33.312   2. Left retinal detachment  H33.22    3. Retinal edema  H35.81 OCT, Retina - OU - Both Eyes  4. Intermediate stage nonexudative age-related macular degeneration of both eyes  H35.3132   5. Choroidal nevus of right eye  D31.31   6. Essential hypertension  I10   7. Hypertensive retinopathy of both eyes  H35.033   8. Pseudophakia of both eyes  Z96.1     1-3. Retinal tear with focal retinal detachment, OS  - tear located from 0330-0430 with  surrounding SRF and thin pigmented demarcation line  - s/p laser retinopexy OS (03.25.20) -- good laser surrounding  - completed PF QID OS x7 days  - pt is cleared from retina standpoint for release back to Dr. Ellie Lunch  - pt wishes to f/u here prn  4. Age related macular degeneration, non-exudative, both eyes  - stable  - The incidence, anatomy, and pathology of dry AMD, risk of progression, and the AREDS and AREDS 2 study including smoking risks discussed with patient.  - Recommend amsler grid monitoring  5. Choroidal nevus OD  - 1.5x2 DD flat pigmented lesion -- stable  - no SRF or orange pigment  - monitor  6,7. Hypertensive retinopathy OU  - discussed importance of tight BP control  - monitor  8. Pseudophakia OU  - s/p CE/IOL OU by Dr. Ellie Lunch (OD: distance, OS: near)  - beautiful surgeries, doing well  - open PC OU  - monitor    Ophthalmic Meds Ordered this visit:  No orders of the defined types were placed in this encounter.      Return if symptoms worsen or fail to improve.  There are no Patient Instructions on file for this visit.   Explained the diagnoses, plan, and follow up with the patient and they expressed understanding.  Patient expressed understanding of the importance of proper follow up care.   This document serves as a record of services personally performed by Gardiner Sleeper, MD, PhD. It was created on their behalf by Ernest Mallick, OA, an ophthalmic assistant. The creation of this record is the provider's dictation and/or activities during the visit.     Electronically signed by: Ernest Mallick, OA  06.22.2020 8:36 AM    Gardiner Sleeper, M.D., Ph.D. Diseases & Surgery of the Retina and Vitreous [facility]  I have reviewed the above documentation for accuracy and completeness, and I agree with the above. Gardiner Sleeper, M.D., Ph.D. 09/01/18 8:36 AM     Abbreviations: M myopia (nearsighted); A astigmatism; H hyperopia (farsighted); P presbyopia; Mrx spectacle prescription;  CTL contact lenses; OD right eye; OS left eye; OU both eyes  XT exotropia; ET esotropia; PEK punctate epithelial keratitis; PEE punctate epithelial erosions; DES dry eye syndrome; MGD meibomian gland dysfunction; ATs artificial tears; PFAT's preservative free artificial tears; [place] nuclear sclerotic cataract; PSC posterior subcapsular cataract; ERM epi-retinal membrane; PVD posterior vitreous detachment; RD retinal detachment; DM diabetes mellitus; DR diabetic retinopathy; NPDR non-proliferative diabetic retinopathy; PDR proliferative diabetic retinopathy; CSME clinically significant macular edema; DME diabetic macular edema; dbh dot blot hemorrhages; CWS cotton wool spot; POAG primary open angle glaucoma; C/D cup-to-disc ratio; HVF humphrey visual field; GVF goldmann visual field; OCT optical coherence tomography; IOP intraocular pressure; BRVO Branch retinal vein occlusion; CRVO central retinal vein occlusion; CRAO central retinal artery occlusion; BRAO branch retinal artery occlusion; RT retinal tear; SB scleral buckle; PPV pars plana vitrectomy; VH Vitreous hemorrhage; PRP panretinal laser photocoagulation; IVK intravitreal kenalog; VMT vitreomacular traction; MH Macular hole;  NVD neovascularization of the disc; NVE neovascularization elsewhere; AREDS age related eye disease study; ARMD age related macular degeneration; POAG primary open angle glaucoma; EBMD epithelial/anterior basement membrane dystrophy; ACIOL anterior chamber intraocular lens; IOL  intraocular lens; PCIOL posterior chamber intraocular lens; Phaco/IOL phacoemulsification with intraocular lens placement; Hallett photorefractive keratectomy; LASIK laser assisted in situ keratomileusis; HTN hypertension; DM diabetes mellitus; COPD chronic obstructive pulmonary disease

## 2018-09-01 ENCOUNTER — Encounter (INDEPENDENT_AMBULATORY_CARE_PROVIDER_SITE_OTHER): Payer: Self-pay | Admitting: Ophthalmology

## 2018-09-01 ENCOUNTER — Ambulatory Visit (INDEPENDENT_AMBULATORY_CARE_PROVIDER_SITE_OTHER): Payer: Medicare Other | Admitting: Ophthalmology

## 2018-09-01 ENCOUNTER — Other Ambulatory Visit: Payer: Self-pay

## 2018-09-01 DIAGNOSIS — H353132 Nonexudative age-related macular degeneration, bilateral, intermediate dry stage: Secondary | ICD-10-CM

## 2018-09-01 DIAGNOSIS — H35033 Hypertensive retinopathy, bilateral: Secondary | ICD-10-CM

## 2018-09-01 DIAGNOSIS — H3322 Serous retinal detachment, left eye: Secondary | ICD-10-CM

## 2018-09-01 DIAGNOSIS — H33312 Horseshoe tear of retina without detachment, left eye: Secondary | ICD-10-CM

## 2018-09-01 DIAGNOSIS — D3131 Benign neoplasm of right choroid: Secondary | ICD-10-CM

## 2018-09-01 DIAGNOSIS — H3581 Retinal edema: Secondary | ICD-10-CM

## 2018-09-01 DIAGNOSIS — I10 Essential (primary) hypertension: Secondary | ICD-10-CM

## 2018-09-01 DIAGNOSIS — Z961 Presence of intraocular lens: Secondary | ICD-10-CM | POA: Diagnosis not present

## 2018-09-17 DIAGNOSIS — D485 Neoplasm of uncertain behavior of skin: Secondary | ICD-10-CM | POA: Diagnosis not present

## 2018-09-17 DIAGNOSIS — D2271 Melanocytic nevi of right lower limb, including hip: Secondary | ICD-10-CM | POA: Diagnosis not present

## 2018-09-17 DIAGNOSIS — D2261 Melanocytic nevi of right upper limb, including shoulder: Secondary | ICD-10-CM | POA: Diagnosis not present

## 2018-09-17 DIAGNOSIS — C44319 Basal cell carcinoma of skin of other parts of face: Secondary | ICD-10-CM | POA: Diagnosis not present

## 2018-09-17 DIAGNOSIS — D1801 Hemangioma of skin and subcutaneous tissue: Secondary | ICD-10-CM | POA: Diagnosis not present

## 2018-09-17 DIAGNOSIS — Z85828 Personal history of other malignant neoplasm of skin: Secondary | ICD-10-CM | POA: Diagnosis not present

## 2018-09-17 DIAGNOSIS — L570 Actinic keratosis: Secondary | ICD-10-CM | POA: Diagnosis not present

## 2018-09-17 DIAGNOSIS — L821 Other seborrheic keratosis: Secondary | ICD-10-CM | POA: Diagnosis not present

## 2018-09-17 DIAGNOSIS — L84 Corns and callosities: Secondary | ICD-10-CM | POA: Diagnosis not present

## 2018-09-17 DIAGNOSIS — D225 Melanocytic nevi of trunk: Secondary | ICD-10-CM | POA: Diagnosis not present

## 2018-09-17 DIAGNOSIS — D2272 Melanocytic nevi of left lower limb, including hip: Secondary | ICD-10-CM | POA: Diagnosis not present

## 2018-12-17 DIAGNOSIS — Z23 Encounter for immunization: Secondary | ICD-10-CM | POA: Diagnosis not present

## 2019-01-26 DIAGNOSIS — E7849 Other hyperlipidemia: Secondary | ICD-10-CM | POA: Diagnosis not present

## 2019-01-26 DIAGNOSIS — E038 Other specified hypothyroidism: Secondary | ICD-10-CM | POA: Diagnosis not present

## 2019-01-26 DIAGNOSIS — I10 Essential (primary) hypertension: Secondary | ICD-10-CM | POA: Diagnosis not present

## 2019-02-02 DIAGNOSIS — E663 Overweight: Secondary | ICD-10-CM | POA: Diagnosis not present

## 2019-02-02 DIAGNOSIS — I10 Essential (primary) hypertension: Secondary | ICD-10-CM | POA: Diagnosis not present

## 2019-02-02 DIAGNOSIS — Z1331 Encounter for screening for depression: Secondary | ICD-10-CM | POA: Diagnosis not present

## 2019-02-02 DIAGNOSIS — H9190 Unspecified hearing loss, unspecified ear: Secondary | ICD-10-CM | POA: Diagnosis not present

## 2019-02-02 DIAGNOSIS — H3530 Unspecified macular degeneration: Secondary | ICD-10-CM | POA: Diagnosis not present

## 2019-02-02 DIAGNOSIS — L84 Corns and callosities: Secondary | ICD-10-CM | POA: Diagnosis not present

## 2019-02-02 DIAGNOSIS — C4491 Basal cell carcinoma of skin, unspecified: Secondary | ICD-10-CM | POA: Diagnosis not present

## 2019-02-02 DIAGNOSIS — R194 Change in bowel habit: Secondary | ICD-10-CM | POA: Diagnosis not present

## 2019-02-02 DIAGNOSIS — Z0000 Encounter for general adult medical examination without abnormal findings: Secondary | ICD-10-CM | POA: Diagnosis not present

## 2019-02-02 DIAGNOSIS — R918 Other nonspecific abnormal finding of lung field: Secondary | ICD-10-CM | POA: Diagnosis not present

## 2019-02-02 DIAGNOSIS — E785 Hyperlipidemia, unspecified: Secondary | ICD-10-CM | POA: Diagnosis not present

## 2019-02-02 DIAGNOSIS — I700 Atherosclerosis of aorta: Secondary | ICD-10-CM | POA: Diagnosis not present

## 2019-03-23 DIAGNOSIS — Z23 Encounter for immunization: Secondary | ICD-10-CM | POA: Diagnosis not present

## 2019-03-25 DIAGNOSIS — Z85828 Personal history of other malignant neoplasm of skin: Secondary | ICD-10-CM | POA: Diagnosis not present

## 2019-03-25 DIAGNOSIS — D1801 Hemangioma of skin and subcutaneous tissue: Secondary | ICD-10-CM | POA: Diagnosis not present

## 2019-03-25 DIAGNOSIS — L570 Actinic keratosis: Secondary | ICD-10-CM | POA: Diagnosis not present

## 2019-03-25 DIAGNOSIS — L821 Other seborrheic keratosis: Secondary | ICD-10-CM | POA: Diagnosis not present

## 2019-03-25 DIAGNOSIS — D485 Neoplasm of uncertain behavior of skin: Secondary | ICD-10-CM | POA: Diagnosis not present

## 2019-03-25 DIAGNOSIS — D2271 Melanocytic nevi of right lower limb, including hip: Secondary | ICD-10-CM | POA: Diagnosis not present

## 2019-03-25 DIAGNOSIS — D225 Melanocytic nevi of trunk: Secondary | ICD-10-CM | POA: Diagnosis not present

## 2019-03-25 DIAGNOSIS — L814 Other melanin hyperpigmentation: Secondary | ICD-10-CM | POA: Diagnosis not present

## 2019-03-25 DIAGNOSIS — D2272 Melanocytic nevi of left lower limb, including hip: Secondary | ICD-10-CM | POA: Diagnosis not present

## 2019-03-25 DIAGNOSIS — D2261 Melanocytic nevi of right upper limb, including shoulder: Secondary | ICD-10-CM | POA: Diagnosis not present

## 2019-03-25 DIAGNOSIS — C44519 Basal cell carcinoma of skin of other part of trunk: Secondary | ICD-10-CM | POA: Diagnosis not present

## 2019-04-20 DIAGNOSIS — Z23 Encounter for immunization: Secondary | ICD-10-CM | POA: Diagnosis not present

## 2019-05-20 DIAGNOSIS — Z570 Occupational exposure to noise: Secondary | ICD-10-CM | POA: Diagnosis not present

## 2019-05-20 DIAGNOSIS — H9313 Tinnitus, bilateral: Secondary | ICD-10-CM | POA: Diagnosis not present

## 2019-05-20 DIAGNOSIS — H903 Sensorineural hearing loss, bilateral: Secondary | ICD-10-CM | POA: Diagnosis not present

## 2019-06-01 DIAGNOSIS — Z961 Presence of intraocular lens: Secondary | ICD-10-CM | POA: Diagnosis not present

## 2019-06-01 DIAGNOSIS — D3131 Benign neoplasm of right choroid: Secondary | ICD-10-CM | POA: Diagnosis not present

## 2019-06-01 DIAGNOSIS — H524 Presbyopia: Secondary | ICD-10-CM | POA: Diagnosis not present

## 2019-06-01 DIAGNOSIS — H353132 Nonexudative age-related macular degeneration, bilateral, intermediate dry stage: Secondary | ICD-10-CM | POA: Diagnosis not present

## 2019-08-17 DIAGNOSIS — E871 Hypo-osmolality and hyponatremia: Secondary | ICD-10-CM | POA: Diagnosis not present

## 2019-08-17 DIAGNOSIS — H3530 Unspecified macular degeneration: Secondary | ICD-10-CM | POA: Diagnosis not present

## 2019-08-17 DIAGNOSIS — R0981 Nasal congestion: Secondary | ICD-10-CM | POA: Diagnosis not present

## 2019-08-17 DIAGNOSIS — H9190 Unspecified hearing loss, unspecified ear: Secondary | ICD-10-CM | POA: Diagnosis not present

## 2019-08-17 DIAGNOSIS — I10 Essential (primary) hypertension: Secondary | ICD-10-CM | POA: Diagnosis not present

## 2019-08-17 DIAGNOSIS — E039 Hypothyroidism, unspecified: Secondary | ICD-10-CM | POA: Diagnosis not present

## 2019-08-17 DIAGNOSIS — E663 Overweight: Secondary | ICD-10-CM | POA: Diagnosis not present

## 2019-08-17 DIAGNOSIS — C4491 Basal cell carcinoma of skin, unspecified: Secondary | ICD-10-CM | POA: Diagnosis not present

## 2019-08-24 DIAGNOSIS — E038 Other specified hypothyroidism: Secondary | ICD-10-CM | POA: Diagnosis not present

## 2019-10-05 DIAGNOSIS — R300 Dysuria: Secondary | ICD-10-CM | POA: Diagnosis not present

## 2019-10-05 DIAGNOSIS — N3001 Acute cystitis with hematuria: Secondary | ICD-10-CM | POA: Diagnosis not present

## 2019-10-06 DIAGNOSIS — N3000 Acute cystitis without hematuria: Secondary | ICD-10-CM | POA: Diagnosis not present

## 2019-10-20 DIAGNOSIS — D1801 Hemangioma of skin and subcutaneous tissue: Secondary | ICD-10-CM | POA: Diagnosis not present

## 2019-10-20 DIAGNOSIS — Z85828 Personal history of other malignant neoplasm of skin: Secondary | ICD-10-CM | POA: Diagnosis not present

## 2019-10-20 DIAGNOSIS — L821 Other seborrheic keratosis: Secondary | ICD-10-CM | POA: Diagnosis not present

## 2019-10-20 DIAGNOSIS — D2261 Melanocytic nevi of right upper limb, including shoulder: Secondary | ICD-10-CM | POA: Diagnosis not present

## 2019-10-20 DIAGNOSIS — D2272 Melanocytic nevi of left lower limb, including hip: Secondary | ICD-10-CM | POA: Diagnosis not present

## 2019-10-20 DIAGNOSIS — D225 Melanocytic nevi of trunk: Secondary | ICD-10-CM | POA: Diagnosis not present

## 2019-10-20 DIAGNOSIS — D2271 Melanocytic nevi of right lower limb, including hip: Secondary | ICD-10-CM | POA: Diagnosis not present

## 2019-10-20 DIAGNOSIS — L820 Inflamed seborrheic keratosis: Secondary | ICD-10-CM | POA: Diagnosis not present

## 2019-11-01 DIAGNOSIS — R351 Nocturia: Secondary | ICD-10-CM | POA: Diagnosis not present

## 2019-11-01 DIAGNOSIS — N401 Enlarged prostate with lower urinary tract symptoms: Secondary | ICD-10-CM | POA: Diagnosis not present

## 2019-11-01 DIAGNOSIS — N3000 Acute cystitis without hematuria: Secondary | ICD-10-CM | POA: Diagnosis not present

## 2019-12-03 DIAGNOSIS — R351 Nocturia: Secondary | ICD-10-CM | POA: Diagnosis not present

## 2019-12-03 DIAGNOSIS — R972 Elevated prostate specific antigen [PSA]: Secondary | ICD-10-CM | POA: Diagnosis not present

## 2019-12-18 DIAGNOSIS — Z23 Encounter for immunization: Secondary | ICD-10-CM | POA: Diagnosis not present

## 2020-01-15 DIAGNOSIS — Z23 Encounter for immunization: Secondary | ICD-10-CM | POA: Diagnosis not present

## 2020-01-31 DIAGNOSIS — E785 Hyperlipidemia, unspecified: Secondary | ICD-10-CM | POA: Diagnosis not present

## 2020-01-31 DIAGNOSIS — E039 Hypothyroidism, unspecified: Secondary | ICD-10-CM | POA: Diagnosis not present

## 2020-01-31 DIAGNOSIS — Z125 Encounter for screening for malignant neoplasm of prostate: Secondary | ICD-10-CM | POA: Diagnosis not present

## 2020-02-07 DIAGNOSIS — I700 Atherosclerosis of aorta: Secondary | ICD-10-CM | POA: Diagnosis not present

## 2020-02-07 DIAGNOSIS — R82998 Other abnormal findings in urine: Secondary | ICD-10-CM | POA: Diagnosis not present

## 2020-02-07 DIAGNOSIS — R194 Change in bowel habit: Secondary | ICD-10-CM | POA: Diagnosis not present

## 2020-02-07 DIAGNOSIS — I10 Essential (primary) hypertension: Secondary | ICD-10-CM | POA: Diagnosis not present

## 2020-02-07 DIAGNOSIS — Z0000 Encounter for general adult medical examination without abnormal findings: Secondary | ICD-10-CM | POA: Diagnosis not present

## 2020-02-07 DIAGNOSIS — E785 Hyperlipidemia, unspecified: Secondary | ICD-10-CM | POA: Diagnosis not present

## 2020-02-07 DIAGNOSIS — R251 Tremor, unspecified: Secondary | ICD-10-CM | POA: Diagnosis not present

## 2020-02-07 DIAGNOSIS — E663 Overweight: Secondary | ICD-10-CM | POA: Diagnosis not present

## 2020-02-07 DIAGNOSIS — H9190 Unspecified hearing loss, unspecified ear: Secondary | ICD-10-CM | POA: Diagnosis not present

## 2020-02-07 DIAGNOSIS — N401 Enlarged prostate with lower urinary tract symptoms: Secondary | ICD-10-CM | POA: Diagnosis not present

## 2020-02-07 DIAGNOSIS — E039 Hypothyroidism, unspecified: Secondary | ICD-10-CM | POA: Diagnosis not present

## 2020-02-07 DIAGNOSIS — K30 Functional dyspepsia: Secondary | ICD-10-CM | POA: Diagnosis not present

## 2020-02-24 DIAGNOSIS — Z1212 Encounter for screening for malignant neoplasm of rectum: Secondary | ICD-10-CM | POA: Diagnosis not present

## 2020-04-27 DIAGNOSIS — D2262 Melanocytic nevi of left upper limb, including shoulder: Secondary | ICD-10-CM | POA: Diagnosis not present

## 2020-04-27 DIAGNOSIS — D2261 Melanocytic nevi of right upper limb, including shoulder: Secondary | ICD-10-CM | POA: Diagnosis not present

## 2020-04-27 DIAGNOSIS — I788 Other diseases of capillaries: Secondary | ICD-10-CM | POA: Diagnosis not present

## 2020-04-27 DIAGNOSIS — L570 Actinic keratosis: Secondary | ICD-10-CM | POA: Diagnosis not present

## 2020-04-27 DIAGNOSIS — D2271 Melanocytic nevi of right lower limb, including hip: Secondary | ICD-10-CM | POA: Diagnosis not present

## 2020-04-27 DIAGNOSIS — C44612 Basal cell carcinoma of skin of right upper limb, including shoulder: Secondary | ICD-10-CM | POA: Diagnosis not present

## 2020-04-27 DIAGNOSIS — Z85828 Personal history of other malignant neoplasm of skin: Secondary | ICD-10-CM | POA: Diagnosis not present

## 2020-04-27 DIAGNOSIS — L820 Inflamed seborrheic keratosis: Secondary | ICD-10-CM | POA: Diagnosis not present

## 2020-04-27 DIAGNOSIS — D485 Neoplasm of uncertain behavior of skin: Secondary | ICD-10-CM | POA: Diagnosis not present

## 2020-04-27 DIAGNOSIS — D2272 Melanocytic nevi of left lower limb, including hip: Secondary | ICD-10-CM | POA: Diagnosis not present

## 2020-04-27 DIAGNOSIS — L918 Other hypertrophic disorders of the skin: Secondary | ICD-10-CM | POA: Diagnosis not present

## 2020-04-27 DIAGNOSIS — L821 Other seborrheic keratosis: Secondary | ICD-10-CM | POA: Diagnosis not present

## 2020-06-07 DIAGNOSIS — H353131 Nonexudative age-related macular degeneration, bilateral, early dry stage: Secondary | ICD-10-CM | POA: Diagnosis not present

## 2020-06-07 DIAGNOSIS — H52203 Unspecified astigmatism, bilateral: Secondary | ICD-10-CM | POA: Diagnosis not present

## 2020-06-07 DIAGNOSIS — Z961 Presence of intraocular lens: Secondary | ICD-10-CM | POA: Diagnosis not present

## 2020-06-07 DIAGNOSIS — D3131 Benign neoplasm of right choroid: Secondary | ICD-10-CM | POA: Diagnosis not present

## 2020-06-12 DIAGNOSIS — H00011 Hordeolum externum right upper eyelid: Secondary | ICD-10-CM | POA: Diagnosis not present

## 2020-06-20 ENCOUNTER — Other Ambulatory Visit: Payer: Self-pay

## 2020-06-20 ENCOUNTER — Ambulatory Visit: Payer: Medicare Other | Attending: Internal Medicine

## 2020-06-20 ENCOUNTER — Other Ambulatory Visit (HOSPITAL_BASED_OUTPATIENT_CLINIC_OR_DEPARTMENT_OTHER): Payer: Self-pay

## 2020-06-20 DIAGNOSIS — Z23 Encounter for immunization: Secondary | ICD-10-CM

## 2020-06-20 NOTE — Progress Notes (Signed)
   Covid-19 Vaccination Clinic  Name:  BECKHAM CAPISTRAN    MRN: [id number] DOB: 1933/03/03  06/20/2020  Mr. Colantuono was observed post Covid-19 immunization for 15 minutes without incident. He was provided with Vaccine Information Sheet and instruction to access the V-Safe system.   Mr. Sigg was instructed to call 911 with any severe reactions post vaccine: Marland Kitchen Difficulty breathing  . Swelling of face and throat  . A fast heartbeat  . A bad rash all over body  . Dizziness and weakness   Immunizations Administered    Name Date Dose VIS Date Route   Moderna Covid-19 Booster Vaccine 06/20/2020  3:51 PM 0.25 mL 12/29/2019 Intramuscular   Manufacturer: Levan Hurst   Lot: [id number]   [place]: 03546-568-12

## 2020-06-23 ENCOUNTER — Other Ambulatory Visit (HOSPITAL_BASED_OUTPATIENT_CLINIC_OR_DEPARTMENT_OTHER): Payer: Self-pay

## 2020-06-23 MED ORDER — COVID-19 MRNA VACC (MODERNA) 100 MCG/0.5ML IM SUSP
INTRAMUSCULAR | 0 refills | Status: DC
  Filled 2020-06-23: qty 0.25, 1d supply, fill #0

## 2020-08-11 DIAGNOSIS — I700 Atherosclerosis of aorta: Secondary | ICD-10-CM | POA: Diagnosis not present

## 2020-08-11 DIAGNOSIS — R918 Other nonspecific abnormal finding of lung field: Secondary | ICD-10-CM | POA: Diagnosis not present

## 2020-08-11 DIAGNOSIS — R251 Tremor, unspecified: Secondary | ICD-10-CM | POA: Diagnosis not present

## 2020-08-11 DIAGNOSIS — E871 Hypo-osmolality and hyponatremia: Secondary | ICD-10-CM | POA: Diagnosis not present

## 2020-08-11 DIAGNOSIS — E663 Overweight: Secondary | ICD-10-CM | POA: Diagnosis not present

## 2020-08-11 DIAGNOSIS — H9190 Unspecified hearing loss, unspecified ear: Secondary | ICD-10-CM | POA: Diagnosis not present

## 2020-08-11 DIAGNOSIS — K30 Functional dyspepsia: Secondary | ICD-10-CM | POA: Diagnosis not present

## 2020-08-11 DIAGNOSIS — F5104 Psychophysiologic insomnia: Secondary | ICD-10-CM | POA: Diagnosis not present

## 2020-08-11 DIAGNOSIS — E039 Hypothyroidism, unspecified: Secondary | ICD-10-CM | POA: Diagnosis not present

## 2020-08-11 DIAGNOSIS — I10 Essential (primary) hypertension: Secondary | ICD-10-CM | POA: Diagnosis not present

## 2020-08-11 DIAGNOSIS — N401 Enlarged prostate with lower urinary tract symptoms: Secondary | ICD-10-CM | POA: Diagnosis not present

## 2020-08-11 DIAGNOSIS — E785 Hyperlipidemia, unspecified: Secondary | ICD-10-CM | POA: Diagnosis not present

## 2021-01-10 ENCOUNTER — Other Ambulatory Visit (HOSPITAL_COMMUNITY): Payer: Self-pay

## 2021-01-10 ENCOUNTER — Other Ambulatory Visit (HOSPITAL_BASED_OUTPATIENT_CLINIC_OR_DEPARTMENT_OTHER): Payer: Self-pay

## 2021-02-07 DIAGNOSIS — L821 Other seborrheic keratosis: Secondary | ICD-10-CM | POA: Diagnosis not present

## 2021-02-07 DIAGNOSIS — Z85828 Personal history of other malignant neoplasm of skin: Secondary | ICD-10-CM | POA: Diagnosis not present

## 2021-02-07 DIAGNOSIS — D1801 Hemangioma of skin and subcutaneous tissue: Secondary | ICD-10-CM | POA: Diagnosis not present

## 2021-02-07 DIAGNOSIS — L918 Other hypertrophic disorders of the skin: Secondary | ICD-10-CM | POA: Diagnosis not present

## 2021-02-07 DIAGNOSIS — D2272 Melanocytic nevi of left lower limb, including hip: Secondary | ICD-10-CM | POA: Diagnosis not present

## 2021-02-07 DIAGNOSIS — D2261 Melanocytic nevi of right upper limb, including shoulder: Secondary | ICD-10-CM | POA: Diagnosis not present

## 2021-02-07 DIAGNOSIS — D2271 Melanocytic nevi of right lower limb, including hip: Secondary | ICD-10-CM | POA: Diagnosis not present

## 2021-02-07 DIAGNOSIS — L570 Actinic keratosis: Secondary | ICD-10-CM | POA: Diagnosis not present

## 2021-02-07 DIAGNOSIS — D225 Melanocytic nevi of trunk: Secondary | ICD-10-CM | POA: Diagnosis not present

## 2021-02-20 DIAGNOSIS — E785 Hyperlipidemia, unspecified: Secondary | ICD-10-CM | POA: Diagnosis not present

## 2021-02-20 DIAGNOSIS — E039 Hypothyroidism, unspecified: Secondary | ICD-10-CM | POA: Diagnosis not present

## 2021-02-20 DIAGNOSIS — Z125 Encounter for screening for malignant neoplasm of prostate: Secondary | ICD-10-CM | POA: Diagnosis not present

## 2021-02-20 DIAGNOSIS — I10 Essential (primary) hypertension: Secondary | ICD-10-CM | POA: Diagnosis not present

## 2021-02-27 DIAGNOSIS — Z0000 Encounter for general adult medical examination without abnormal findings: Secondary | ICD-10-CM | POA: Diagnosis not present

## 2021-02-27 DIAGNOSIS — E039 Hypothyroidism, unspecified: Secondary | ICD-10-CM | POA: Diagnosis not present

## 2021-02-27 DIAGNOSIS — R194 Change in bowel habit: Secondary | ICD-10-CM | POA: Diagnosis not present

## 2021-02-27 DIAGNOSIS — R251 Tremor, unspecified: Secondary | ICD-10-CM | POA: Diagnosis not present

## 2021-02-27 DIAGNOSIS — E785 Hyperlipidemia, unspecified: Secondary | ICD-10-CM | POA: Diagnosis not present

## 2021-02-27 DIAGNOSIS — R82998 Other abnormal findings in urine: Secondary | ICD-10-CM | POA: Diagnosis not present

## 2021-02-27 DIAGNOSIS — I700 Atherosclerosis of aorta: Secondary | ICD-10-CM | POA: Diagnosis not present

## 2021-02-27 DIAGNOSIS — F5104 Psychophysiologic insomnia: Secondary | ICD-10-CM | POA: Diagnosis not present

## 2021-02-27 DIAGNOSIS — E663 Overweight: Secondary | ICD-10-CM | POA: Diagnosis not present

## 2021-02-27 DIAGNOSIS — Z1331 Encounter for screening for depression: Secondary | ICD-10-CM | POA: Diagnosis not present

## 2021-02-27 DIAGNOSIS — N401 Enlarged prostate with lower urinary tract symptoms: Secondary | ICD-10-CM | POA: Diagnosis not present

## 2021-02-27 DIAGNOSIS — K30 Functional dyspepsia: Secondary | ICD-10-CM | POA: Diagnosis not present

## 2021-02-27 DIAGNOSIS — I10 Essential (primary) hypertension: Secondary | ICD-10-CM | POA: Diagnosis not present

## 2021-06-01 DIAGNOSIS — N401 Enlarged prostate with lower urinary tract symptoms: Secondary | ICD-10-CM | POA: Diagnosis not present

## 2021-06-01 DIAGNOSIS — R972 Elevated prostate specific antigen [PSA]: Secondary | ICD-10-CM | POA: Diagnosis not present

## 2021-06-01 DIAGNOSIS — R351 Nocturia: Secondary | ICD-10-CM | POA: Diagnosis not present

## 2021-06-20 DIAGNOSIS — H353131 Nonexudative age-related macular degeneration, bilateral, early dry stage: Secondary | ICD-10-CM | POA: Diagnosis not present

## 2021-06-20 DIAGNOSIS — Z961 Presence of intraocular lens: Secondary | ICD-10-CM | POA: Diagnosis not present

## 2021-06-20 DIAGNOSIS — D3131 Benign neoplasm of right choroid: Secondary | ICD-10-CM | POA: Diagnosis not present

## 2021-08-09 DIAGNOSIS — L814 Other melanin hyperpigmentation: Secondary | ICD-10-CM | POA: Diagnosis not present

## 2021-08-09 DIAGNOSIS — L821 Other seborrheic keratosis: Secondary | ICD-10-CM | POA: Diagnosis not present

## 2021-08-09 DIAGNOSIS — D485 Neoplasm of uncertain behavior of skin: Secondary | ICD-10-CM | POA: Diagnosis not present

## 2021-08-09 DIAGNOSIS — D3617 Benign neoplasm of peripheral nerves and autonomic nervous system of trunk, unspecified: Secondary | ICD-10-CM | POA: Diagnosis not present

## 2021-08-09 DIAGNOSIS — Z85828 Personal history of other malignant neoplasm of skin: Secondary | ICD-10-CM | POA: Diagnosis not present

## 2021-08-09 DIAGNOSIS — L570 Actinic keratosis: Secondary | ICD-10-CM | POA: Diagnosis not present

## 2021-08-27 DIAGNOSIS — K30 Functional dyspepsia: Secondary | ICD-10-CM | POA: Diagnosis not present

## 2021-08-27 DIAGNOSIS — C4491 Basal cell carcinoma of skin, unspecified: Secondary | ICD-10-CM | POA: Diagnosis not present

## 2021-08-27 DIAGNOSIS — I10 Essential (primary) hypertension: Secondary | ICD-10-CM | POA: Diagnosis not present

## 2021-08-27 DIAGNOSIS — R251 Tremor, unspecified: Secondary | ICD-10-CM | POA: Diagnosis not present

## 2021-08-27 DIAGNOSIS — E785 Hyperlipidemia, unspecified: Secondary | ICD-10-CM | POA: Diagnosis not present

## 2021-08-27 DIAGNOSIS — E663 Overweight: Secondary | ICD-10-CM | POA: Diagnosis not present

## 2021-08-27 DIAGNOSIS — R5383 Other fatigue: Secondary | ICD-10-CM | POA: Diagnosis not present

## 2021-08-27 DIAGNOSIS — E039 Hypothyroidism, unspecified: Secondary | ICD-10-CM | POA: Diagnosis not present

## 2021-08-27 DIAGNOSIS — R413 Other amnesia: Secondary | ICD-10-CM | POA: Diagnosis not present

## 2021-08-27 DIAGNOSIS — R194 Change in bowel habit: Secondary | ICD-10-CM | POA: Diagnosis not present

## 2021-08-27 DIAGNOSIS — I700 Atherosclerosis of aorta: Secondary | ICD-10-CM | POA: Diagnosis not present

## 2021-08-27 DIAGNOSIS — N401 Enlarged prostate with lower urinary tract symptoms: Secondary | ICD-10-CM | POA: Diagnosis not present

## 2021-10-09 DIAGNOSIS — I10 Essential (primary) hypertension: Secondary | ICD-10-CM | POA: Diagnosis not present

## 2021-10-09 DIAGNOSIS — R5383 Other fatigue: Secondary | ICD-10-CM | POA: Diagnosis not present

## 2021-10-09 DIAGNOSIS — E785 Hyperlipidemia, unspecified: Secondary | ICD-10-CM | POA: Diagnosis not present

## 2021-10-09 DIAGNOSIS — E871 Hypo-osmolality and hyponatremia: Secondary | ICD-10-CM | POA: Diagnosis not present

## 2021-10-09 DIAGNOSIS — I493 Ventricular premature depolarization: Secondary | ICD-10-CM | POA: Diagnosis not present

## 2021-10-09 DIAGNOSIS — R251 Tremor, unspecified: Secondary | ICD-10-CM | POA: Diagnosis not present

## 2021-10-09 DIAGNOSIS — R0602 Shortness of breath: Secondary | ICD-10-CM | POA: Diagnosis not present

## 2021-10-09 DIAGNOSIS — R0981 Nasal congestion: Secondary | ICD-10-CM | POA: Diagnosis not present

## 2021-10-15 NOTE — Progress Notes (Unsigned)
Cardiology Office Note:    Date:  10/16/2021   ID:  Robert Holder, DOB [birth year], MRN [id number]  PCP:  Shon Baton, MD  Cardiologist:  Shirlee More, MD   Referring MD: Shon Baton, MD  ASSESSMENT:    1. PVC (premature ventricular contraction)   2. Shortness of breath   3. RBBB   4. Essential hypertension   5. Hyperlipidemia, unspecified hyperlipidemia type    PLAN:    In order of problems listed above:  His clinical problem is new PVCs frequent asymptomatic associated shortness of breath and lower extremity edema all occurring after COVID 19 infection.  He may well either have cardiomyopathy with PVCs or cardiomyopathy caused by frequent PVCs for further evaluation of an echocardiogram performed as well as a 1 week ZIO monitor to define left and right ventricular function pulmonary pressures as well as the burden and complexity of ventricular arrhythmia.  For now unguinal hold off on initiating any drug therapy pending the results if ejection fraction is reduced he need a loop diuretic SGLT2 inhibitor initiation of a beta-blocker with his arrhythmia and transition from losartan to Entresto.  For now we will continue his current antihypertensives as well as lipid-lowering therapy including his statin. I will not be having office hours in [place] any longer and he will follow-up with my partner Dr. Harriet Masson in 1 month.  Next appointment 1 month   Medication Adjustments/Labs and Tests Ordered: Current medicines are reviewed at length with the patient today.  Concerns regarding medicines are outlined above.  Orders Placed This Encounter  Procedures   LONG TERM MONITOR (3-14 DAYS)   EKG 12-Lead   ECHOCARDIOGRAM COMPLETE   No orders of the defined types were placed in this encounter.     Chief complaint I have PVCs on my office EKG  History of Present Illness:    Robert Holder is a 88 y.o. male with shortness of breath since COVID infection May 2023 who is being seen today  for the evaluation of PVCs at the request of Shon Baton, MD.  He has a history of hypertension hyperlipidemia previous hyponatremia and tremor.  Breyon has not felt well since he had COVID in April of this year.  It sounds like he received antiviral therapy and he considers he had a minor illness. Since that time he has had exertional shortness of breath walking on incline at times he has to stop and rest to recover and he is just felt weakness.  He even finds himself at times short of breath ambulating room to room in the house. He is unaware of the PVCs and has not had syncope He has had no chest pain He has 4+ bilateral lower extremity edema and both he and his wife are unaware. He has no known history of heart disease congenital rheumatic or atrial fibrillation He takes no over-the-counter proarrhythmic drugs In labs in his PCP office there is a notation that CBC CMP were normal stable but I cannot see the lab results in Care Everywhere Past Medical History:  Diagnosis Date   Cataract    Hypertension     Past Surgical History:  Procedure Laterality Date   CATARACT EXTRACTION     EYE SURGERY     NOSE SURGERY      Current Medications: Current Meds  Medication Sig   acetaminophen (TYLENOL) 500 MG tablet Take 1,000 mg by mouth daily.   aspirin EC 81 MG tablet Take 81 mg by mouth daily.  glucosamine-chondroitin 500-400 MG tablet Take 1 tablet by mouth daily.   levothyroxine (SYNTHROID) 137 MCG tablet Take 137 mcg by mouth daily.   losartan (COZAAR) 100 MG tablet    Multiple Vitamins-Minerals (PRESERVISION AREDS 2 PO) Take 1 tablet by mouth daily.   niacin (NIASPAN) 500 MG CR tablet Take 500 mg by mouth 2 (two) times daily.   sildenafil (VIAGRA) 100 MG tablet    simvastatin (ZOCOR) 20 MG tablet Take 20 mg by mouth daily at 6 PM.     Allergies:   Ciprofloxacin and Zithromax [azithromycin]   Social History   Socioeconomic History   Marital status: Married    Spouse name:  Not on file   Number of children: Not on file   Years of education: Not on file   Highest education level: Not on file  Occupational History   Not on file  Tobacco Use   Smoking status: Never   Smokeless tobacco: Never  Substance and Sexual Activity   Alcohol use: Yes    Alcohol/week: 1.0 standard drink of alcohol    Types: 1 Glasses of wine per week   Drug use: Not on file   Sexual activity: Not on file  Other Topics Concern   Not on file  Social History Narrative   Not on file   Social Determinants of Health   Financial Resource Strain: Not on file  Food Insecurity: Not on file  Transportation Needs: Not on file  Physical Activity: Not on file  Stress: Not on file  Social Connections: Not on file     Family History: The patient's family history is not on file.  ROS:   ROS Please see the history of present illness.     All other systems reviewed and are negative.  EKGs/Labs/Other Studies Reviewed:    The following studies were reviewed today:   EKG:  EKG is  ordered today.  The ekg ordered today is personally reviewed and demonstrates sinus rhythm right bundle branch block normal QT interval normal PR interval frequent PVCs at times bigeminy    Physical Exam:    VS:  BP (!) 178/82   Pulse 69   Ht '5\' 10"'$  (1.778 m)   Wt 183 lb 1.3 oz (83 kg)   SpO2 97%   BMI 26.27 kg/m     Wt Readings from Last 3 Encounters:  10/16/21 183 lb 1.3 oz (83 kg)     GEN: He appears his age well nourished, well developed in no acute distress HEENT: Normal NECK: No JVD; No carotid bruits LYMPHATICS: No lymphadenopathy CARDIAC: Frequent extrasystole RRR, no murmurs, rubs, gallops RESPIRATORY:  Clear to auscultation without rales, wheezing or rhonchi  ABDOMEN: Soft, non-tender, non-distended MUSCULOSKELETAL: 4+ bilateral lower extremity ankle to knee pitting edema; No deformity  SKIN: Warm and dry NEUROLOGIC:  Alert and oriented x 3 PSYCHIATRIC:  Normal affect      Signed, Shirlee More, MD  10/16/2021 4:24 PM    [place]

## 2021-10-16 ENCOUNTER — Encounter: Payer: Self-pay | Admitting: Cardiology

## 2021-10-16 ENCOUNTER — Ambulatory Visit (INDEPENDENT_AMBULATORY_CARE_PROVIDER_SITE_OTHER): Payer: Medicare Other | Admitting: Cardiology

## 2021-10-16 ENCOUNTER — Ambulatory Visit (INDEPENDENT_AMBULATORY_CARE_PROVIDER_SITE_OTHER): Payer: Medicare Other

## 2021-10-16 VITALS — BP 178/82 | HR 69 | Ht 70.0 in | Wt 183.1 lb

## 2021-10-16 DIAGNOSIS — I10 Essential (primary) hypertension: Secondary | ICD-10-CM | POA: Diagnosis not present

## 2021-10-16 DIAGNOSIS — I4510 Unspecified right bundle-branch block: Secondary | ICD-10-CM | POA: Diagnosis not present

## 2021-10-16 DIAGNOSIS — I493 Ventricular premature depolarization: Secondary | ICD-10-CM | POA: Insufficient documentation

## 2021-10-16 DIAGNOSIS — E785 Hyperlipidemia, unspecified: Secondary | ICD-10-CM | POA: Diagnosis not present

## 2021-10-16 DIAGNOSIS — R0602 Shortness of breath: Secondary | ICD-10-CM

## 2021-10-16 DIAGNOSIS — Z7189 Other specified counseling: Secondary | ICD-10-CM | POA: Insufficient documentation

## 2021-10-16 NOTE — Patient Instructions (Signed)
Medication Instructions:  Your physician recommends that you continue on your current medications as directed. Please refer to the Current Medication list given to you today.  *If you need a refill on your cardiac medications before your next appointment, please call your pharmacy*   Lab Work: None If you have labs (blood work) drawn today and your tests are completely normal, you will receive your results only by: [place] (if you have MyChart) OR A paper copy in the mail If you have any lab test that is abnormal or we need to change your treatment, we will call you to review the results.   Testing/Procedures: Your physician has requested that you have an echocardiogram. Echocardiography is a painless test that uses sound waves to create images of your heart. It provides your doctor with information about the size and shape of your heart and how well your heart's chambers and valves are working. This procedure takes approximately one hour. There are no restrictions for this procedure.  A zio monitor was ordered today. It will remain on for 7 days. You will then return monitor and event diary in provided box. It takes 1-2 weeks for report to be downloaded and returned to [place]. We will call you with the results. If monitor falls off or has orange flashing light, please call Zio for further instructions.     Follow-Up: At [facility], you and your health needs are our priority.  As part of our continuing mission to provide you with exceptional heart care, we have created designated Provider Care Teams.  These Care Teams include your primary Cardiologist (physician) and Advanced Practice Providers (APPs -  Physician Assistants and Nurse Practitioners) who all work together to provide you with the care you need, when you need it.  We recommend signing up for the patient portal called "MyChart".  Sign up information is provided on this After Visit Summary.  MyChart is used to connect with  patients for Virtual Visits (Telemedicine).  Patients are able to view lab/test results, encounter notes, upcoming appointments, etc.  Non-urgent messages can be sent to your provider as well.   To learn more about what you can do with MyChart, go to [email or web address].    Your next appointment:   6 week(s)  The format for your next appointment:   In Person  Provider:   Dr. Berniece Salines MD {   Other Instructions None  Important Information About Sugar

## 2021-10-25 ENCOUNTER — Ambulatory Visit (HOSPITAL_BASED_OUTPATIENT_CLINIC_OR_DEPARTMENT_OTHER)
Admission: RE | Admit: 2021-10-25 | Discharge: 2021-10-25 | Disposition: A | Discharge status: Home / Self Care | Payer: Medicare Other | Source: Ambulatory Visit | Attending: Cardiology | Admitting: Cardiology

## 2021-10-25 DIAGNOSIS — R0602 Shortness of breath: Secondary | ICD-10-CM | POA: Insufficient documentation

## 2021-10-25 DIAGNOSIS — I493 Ventricular premature depolarization: Secondary | ICD-10-CM | POA: Diagnosis not present

## 2021-10-25 LAB — ECHOCARDIOGRAM COMPLETE
AR max vel: 3.34 cm2
AV Area VTI: 2.75 cm2
AV Area mean vel: 2.86 cm2
AV Mean grad: 2 mmHg
AV Peak grad: 4.7 mmHg
Ao pk vel: 1.08 m/s
Area-P 1/2: 3.53 cm2
S' Lateral: 3.3 cm

## 2021-10-25 NOTE — Progress Notes (Signed)
  Echocardiogram 2D Echocardiogram has been performed.  Robert Holder F 10/25/2021, 8:13 AM

## 2021-10-26 DIAGNOSIS — R350 Frequency of micturition: Secondary | ICD-10-CM | POA: Diagnosis not present

## 2021-10-29 ENCOUNTER — Ambulatory Visit: Payer: Medicare Other | Admitting: Internal Medicine

## 2021-10-29 DIAGNOSIS — I493 Ventricular premature depolarization: Secondary | ICD-10-CM | POA: Diagnosis not present

## 2021-11-06 ENCOUNTER — Other Ambulatory Visit: Payer: Self-pay

## 2021-11-06 ENCOUNTER — Telehealth: Payer: Self-pay | Admitting: Cardiology

## 2021-11-06 MED ORDER — ACEBUTOLOL HCL 200 MG PO CAPS: 200.0000 mg | ORAL_CAPSULE | Freq: Two times a day (BID) | ORAL | 3 refills | Status: DC

## 2021-11-06 NOTE — Telephone Encounter (Signed)
Follow Up:      Patient is returning Robert Holder's call, concerning his results.

## 2021-11-06 NOTE — Telephone Encounter (Signed)
Patient informed of results.  

## 2021-11-28 ENCOUNTER — Ambulatory Visit: Payer: Medicare Other | Attending: Cardiology | Admitting: Cardiology

## 2021-11-28 ENCOUNTER — Encounter: Payer: Self-pay | Admitting: Cardiology

## 2021-11-28 VITALS — BP 150/80 | HR 53 | Ht 70.0 in | Wt 179.8 lb

## 2021-11-28 DIAGNOSIS — E559 Vitamin D deficiency, unspecified: Secondary | ICD-10-CM | POA: Diagnosis not present

## 2021-11-28 DIAGNOSIS — I493 Ventricular premature depolarization: Secondary | ICD-10-CM

## 2021-11-28 DIAGNOSIS — E785 Hyperlipidemia, unspecified: Secondary | ICD-10-CM | POA: Diagnosis not present

## 2021-11-28 DIAGNOSIS — I10 Essential (primary) hypertension: Secondary | ICD-10-CM

## 2021-11-28 DIAGNOSIS — R5383 Other fatigue: Secondary | ICD-10-CM

## 2021-11-28 DIAGNOSIS — I471 Supraventricular tachycardia: Secondary | ICD-10-CM

## 2021-11-28 NOTE — Patient Instructions (Addendum)
Medication Instructions:  Your physician recommends that you continue on your current medications as directed. Please refer to the Current Medication list given to you today.  *If you need a refill on your cardiac medications before your next appointment, please call your pharmacy*   Lab Work: TODAY: Vitamin D If you have labs (blood work) drawn today and your tests are completely normal, you will receive your results only by: [place] (if you have MyChart) OR A paper copy in the mail If you have any lab test that is abnormal or we need to change your treatment, we will call you to review the results.   Testing/Procedures: None   Follow-Up: At [facility], you and your health needs are our priority.  As part of our continuing mission to provide you with exceptional heart care, we have created designated Provider Care Teams.  These Care Teams include your primary Cardiologist (physician) and Advanced Practice Providers (APPs -  Physician Assistants and Nurse Practitioners) who all work together to provide you with the care you need, when you need it.  We recommend signing up for the patient portal called "MyChart".  Sign up information is provided on this After Visit Summary.  MyChart is used to connect with patients for Virtual Visits (Telemedicine).  Patients are able to view lab/test results, encounter notes, upcoming appointments, etc.  Non-urgent messages can be sent to your provider as well.   To learn more about what you can do with MyChart, go to [email or web address].    Your next appointment:   6 month(s)  The format for your next appointment:   In Person  Provider:   Berniece Salines, DO     Other Instructions   Important Information About Sugar

## 2021-11-28 NOTE — Progress Notes (Signed)
Cardiology Office Note:    Date:  12/02/2021   ID:  Robert Holder, DOB [birth year], MRN [id number]  PCP:  Shon Baton, MD  Cardiologist:  Berniece Salines, DO  Electrophysiologist:  None   Referring MD: Shon Baton, MD   " I am doing ok"  History of Present Illness:    Robert Holder is a 88 y.o. male with a hx of hyperlipidemia, hypertension, recently diagnosed frequent PVCs was started on acebutolol.  The patient is here today with his wife.  Previously he had seen Dr. Bettina Gavia on October 16, 2021 at that time his EKG noted.  Frequent PVCs on monitor was placed on him and echocardiogram was done as well.  In the meantime he had his echocardiogram.  Today he report significant fatigue.   Past Medical History:  Diagnosis Date   Cataract    Hypertension     Past Surgical History:  Procedure Laterality Date   CATARACT EXTRACTION     EYE SURGERY     NOSE SURGERY      Current Medications: Current Meds  Medication Sig   acebutolol (SECTRAL) 200 MG capsule Take 1 capsule (200 mg total) by mouth 2 (two) times daily.   acetaminophen (TYLENOL) 500 MG tablet Take 1,000 mg by mouth daily.   aspirin EC 81 MG tablet Take 81 mg by mouth daily.   glucosamine-chondroitin 500-400 MG tablet Take 1 tablet by mouth daily.   levothyroxine (SYNTHROID) 137 MCG tablet Take 137 mcg by mouth daily.   losartan (COZAAR) 100 MG tablet    Multiple Vitamins-Minerals (PRESERVISION AREDS 2 PO) Take 1 tablet by mouth daily.   niacin (NIASPAN) 500 MG CR tablet Take 500 mg by mouth 2 (two) times daily.   simvastatin (ZOCOR) 20 MG tablet Take 20 mg by mouth daily at 6 PM.     Allergies:   Ciprofloxacin and Zithromax [azithromycin]   Social History   Socioeconomic History   Marital status: Married    Spouse name: Not on file   Number of children: Not on file   Years of education: Not on file   Highest education level: Not on file  Occupational History   Not on file  Tobacco Use   Smoking status:  Never   Smokeless tobacco: Never  Substance and Sexual Activity   Alcohol use: Yes    Alcohol/week: 1.0 standard drink of alcohol    Types: 1 Glasses of wine per week   Drug use: Not on file   Sexual activity: Not on file  Other Topics Concern   Not on file  Social History Narrative   Not on file   Social Determinants of Health   Financial Resource Strain: Not on file  Food Insecurity: Not on file  Transportation Needs: Not on file  Physical Activity: Not on file  Stress: Not on file  Social Connections: Not on file     Family History: The patient's family history is not on file.  ROS:   Review of Systems  Constitution: Negative for decreased appetite, fever and weight gain.  HENT: Negative for congestion, ear discharge, hoarse voice and sore throat.   Eyes: Negative for discharge, redness, vision loss in right eye and visual halos.  Cardiovascular: Negative for chest pain, dyspnea on exertion, leg swelling, orthopnea and palpitations.  Respiratory: Negative for cough, hemoptysis, shortness of breath and snoring.   Endocrine: Negative for heat intolerance and polyphagia.  Hematologic/Lymphatic: Negative for bleeding problem. Does not bruise/bleed easily.  Skin:  Negative for flushing, nail changes, rash and suspicious lesions.  Musculoskeletal: Negative for arthritis, joint pain, muscle cramps, myalgias, neck pain and stiffness.  Gastrointestinal: Negative for abdominal pain, bowel incontinence, diarrhea and excessive appetite.  Genitourinary: Negative for decreased libido, genital sores and incomplete emptying.  Neurological: Negative for brief paralysis, focal weakness, headaches and loss of balance.  Psychiatric/Behavioral: Negative for altered mental status, depression and suicidal ideas.  Allergic/Immunologic: Negative for HIV exposure and persistent infections.    EKGs/Labs/Other Studies Reviewed:    The following studies were reviewed today:   EKG: None today    Zio monitor  Patch Wear Time:  6 days and 21 hours (2023-08-08T16:03:53-0400 to 2023-08-15T13:23:26-0400)   Patient had a min HR of 40 bpm, max HR of 174 bpm, and avg HR of 57 bpm. Predominant underlying rhythm was Sinus Rhythm.    There were 6 triggered and 9 diary events all sinus rhythm and associated with frequent PVCs frequently bigeminy and at times couplets.   1 run of Ventricular Tachycardia occurred lasting 4 beats with a max rate of 160 bpm (avg 115 bpm).    13 Supraventricular Tachycardia runs occurred, the run with the fastest interval lasting 8 beats with a max rate of 174 bpm, the longest lasting 9 beats with an avg rate of 126 bpm.Supraventricular Tachycardia was detected within +/- 45 seconds of symptomatic patient event(s).  There were no episodes of atrial fibrillation or flutter.   Isolated SVEs were rare (<1.0%), SVE Couplets were rare (<1.0%), and SVE Triplets were rare (<1.0%).    Isolated VEs were frequent (15.1%, [id number]), VE Couplets were rare (<1.0%, 1557), and VE Triplets were rare (<1.0%, 14). Ventricular Bigeminy and Trigeminy were present.   Tte 10/25/2021 IMPRESSIONS   1. Left ventricular ejection fraction, by estimation, is 60 to 65%. The  left ventricle has normal function. The left ventricle has no regional  wall motion abnormalities. Left ventricular diastolic parameters are  consistent with Grade I diastolic  dysfunction (impaired relaxation).   2. Right ventricular systolic function is normal. The right ventricular  size is normal. There is mildly elevated pulmonary artery systolic  pressure.   3. Left atrial size was severely dilated.   4. The mitral valve is normal in structure. Mild mitral valve  regurgitation. No evidence of mitral stenosis.   5. The aortic valve is normal in structure. Aortic valve regurgitation is  not visualized. No aortic stenosis is present.   6. The inferior vena cava is normal in size with greater than 50%  respiratory  variability, suggesting right atrial pressure of 3 mmHg.   FINDINGS   Left Ventricle: Left ventricular ejection fraction, by estimation, is 60  to 65%. The left ventricle has normal function. The left ventricle has no  regional wall motion abnormalities. The left ventricular internal cavity  size was normal in size. There is   no left ventricular hypertrophy. Left ventricular diastolic parameters  are consistent with Grade I diastolic dysfunction (impaired relaxation).   Right Ventricle: The right ventricular size is normal. No increase in  right ventricular wall thickness. Right ventricular systolic function is  normal. There is mildly elevated pulmonary artery systolic pressure. The  tricuspid regurgitant velocity is 2.89   m/s, and with an assumed right atrial pressure of 8 mmHg, the estimated  right ventricular systolic pressure is 25.4 mmHg.   Left Atrium: Left atrial size was severely dilated.   Right Atrium: Right atrial size was normal in size.   Pericardium: There  is no evidence of pericardial effusion.   Mitral Valve: The mitral valve is normal in structure. Mild mitral valve  regurgitation. No evidence of mitral valve stenosis.   Tricuspid Valve: The tricuspid valve is normal in structure. Tricuspid  valve regurgitation is mild . No evidence of tricuspid stenosis.   Aortic Valve: The aortic valve is normal in structure. Aortic valve  regurgitation is not visualized. No aortic stenosis is present. Aortic  valve mean gradient measures 2.0 mmHg. Aortic valve peak gradient measures  4.7 mmHg. Aortic valve area, by VTI  measures 2.75 cm.   Pulmonic Valve: The pulmonic valve was normal in structure. Pulmonic valve  regurgitation is not visualized. No evidence of pulmonic stenosis.   Aorta: The aortic root is normal in size and structure.   Venous: The inferior vena cava is normal in size with greater than 50%  respiratory variability, suggesting right atrial pressure of  3 mmHg.   IAS/Shunts: No atrial level shunt detected by color flow Doppler.   Recent Labs: No results found for requested labs within last 365 days.  Recent Lipid Panel No results found for: "CHOL", "TRIG", "HDL", "CHOLHDL", "VLDL", "LDLCALC", "LDLDIRECT"  Physical Exam:    VS:  BP (!) 150/80   Pulse (!) 53   Ht '5\' 10"'$  (1.778 m)   Wt 179 lb 12.8 oz (81.6 kg)   SpO2 98%   BMI 25.80 kg/m     Wt Readings from Last 3 Encounters:  11/28/21 179 lb 12.8 oz (81.6 kg)  10/16/21 183 lb 1.3 oz (83 kg)     GEN: Well nourished, well developed in no acute distress HEENT: Normal NECK: No JVD; No carotid bruits LYMPHATICS: No lymphadenopathy CARDIAC: S1S2 noted,RRR, no murmurs, rubs, gallops RESPIRATORY:  Clear to auscultation without rales, wheezing or rhonchi  ABDOMEN: Soft, non-tender, non-distended, +bowel sounds, no guarding. EXTREMITIES: No edema, No cyanosis, no clubbing MUSCULOSKELETAL:  No deformity  SKIN: Warm and dry NEUROLOGIC:  Alert and oriented x 3, non-focal PSYCHIATRIC:  Normal affect, good insight  ASSESSMENT:    1. Fatigue, unspecified type   2. Vitamin D deficiency   3. Frequent PVCs   4. PSVT (paroxysmal supraventricular tachycardia) ([place])   5. Primary hypertension   6. Hyperlipidemia, unspecified hyperlipidemia type    PLAN:     She is tolerating the acebutolol.  No changes will be made to that medication.  In terms of his fatigue I am going to send vitamin D levels to make sure that this is not causing a problem for the patient. Blood pressure is acceptable, continue with current antihypertensive regimen.  We again reviewed his test results. All of his questions were answered.  The patient is in agreement with the above plan. The patient left the office in stable condition.  The patient will follow up in   Medication Adjustments/Labs and Tests Ordered: Current medicines are reviewed at length with the patient today.  Concerns regarding medicines are  outlined above.  Orders Placed This Encounter  Procedures   VITAMIN D 25 Hydroxy (Vit-D Deficiency, Fractures)   No orders of the defined types were placed in this encounter.   Patient Instructions  Medication Instructions:  Your physician recommends that you continue on your current medications as directed. Please refer to the Current Medication list given to you today.  *If you need a refill on your cardiac medications before your next appointment, please call your pharmacy*   Lab Work: TODAY: Vitamin D If you have labs (blood work)  drawn today and your tests are completely normal, you will receive your results only by: MyChart Message (if you have MyChart) OR A paper copy in the mail If you have any lab test that is abnormal or we need to change your treatment, we will call you to review the results.   Testing/Procedures: None   Follow-Up: At [facility], you and your health needs are our priority.  As part of our continuing mission to provide you with exceptional heart care, we have created designated Provider Care Teams.  These Care Teams include your primary Cardiologist (physician) and Advanced Practice Providers (APPs -  Physician Assistants and Nurse Practitioners) who all work together to provide you with the care you need, when you need it.  We recommend signing up for the patient portal called "MyChart".  Sign up information is provided on this After Visit Summary.  MyChart is used to connect with patients for Virtual Visits (Telemedicine).  Patients are able to view lab/test results, encounter notes, upcoming appointments, etc.  Non-urgent messages can be sent to your provider as well.   To learn more about what you can do with MyChart, go to [email or web address].    Your next appointment:   6 month(s)  The format for your next appointment:   In Person  Provider:   Berniece Salines, DO     Other Instructions   Important Information About  Sugar         Adopting a Healthy Lifestyle.  Know what a healthy weight is for you (roughly BMI <25) and aim to maintain this   Aim for 7+ servings of fruits and vegetables daily   65-80+ fluid ounces of water or unsweet tea for healthy kidneys   Limit to max 1 drink of alcohol per day; avoid smoking/tobacco   Limit animal fats in diet for cholesterol and heart health - choose grass fed whenever available   Avoid highly processed foods, and foods high in saturated/trans fats   Aim for low stress - take time to unwind and care for your mental health   Aim for 150 min of moderate intensity exercise weekly for heart health, and weights twice weekly for bone health   Aim for 7-9 hours of sleep daily   When it comes to diets, agreement about the perfect plan isnt easy to find, even among the experts. Experts at the [employer] developed an idea known as the Healthy Eating Plate. Just imagine a plate divided into logical, healthy portions.   The emphasis is on diet quality:   Load up on vegetables and fruits - one-half of your plate: Aim for color and variety, and remember that potatoes dont count.   Go for whole grains - one-quarter of your plate: Whole wheat, barley, wheat berries, quinoa, oats, brown rice, and foods made with them. If you want pasta, go with whole wheat pasta.   Protein power - one-quarter of your plate: Fish, chicken, beans, and nuts are all healthy, versatile protein sources. Limit red meat.   The diet, however, does go beyond the plate, offering a few other suggestions.   Use healthy plant oils, such as olive, canola, soy, corn, sunflower and peanut. Check the labels, and avoid partially hydrogenated oil, which have unhealthy trans fats.   If youre thirsty, drink water. Coffee and tea are good in moderation, but skip sugary drinks and limit milk and dairy products to one or two daily servings.   The type of carbohydrate  in the diet  is more important than the amount. Some sources of carbohydrates, such as vegetables, fruits, whole grains, and beans-are healthier than others.   Finally, stay active  Signed, Berniece Salines, DO  12/02/2021 4:31 PM    [facility]

## 2021-11-29 ENCOUNTER — Other Ambulatory Visit: Payer: Self-pay

## 2021-11-29 LAB — VITAMIN D 25 HYDROXY (VIT D DEFICIENCY, FRACTURES): Vit D, 25-Hydroxy: 21.1 ng/mL — ABNORMAL LOW (ref 30.0–100.0)

## 2021-11-29 MED ORDER — VITAMIN D (ERGOCALCIFEROL) 1.25 MG (50000 UNIT) PO CAPS: 50000.0000 [IU] | ORAL_CAPSULE | ORAL | 0 refills | Status: DC

## 2021-11-29 NOTE — Progress Notes (Signed)
Prescription sent to pharmacy.

## 2021-12-29 DIAGNOSIS — Z23 Encounter for immunization: Secondary | ICD-10-CM | POA: Diagnosis not present

## 2022-01-01 DIAGNOSIS — M1711 Unilateral primary osteoarthritis, right knee: Secondary | ICD-10-CM | POA: Diagnosis not present

## 2022-01-01 DIAGNOSIS — R0602 Shortness of breath: Secondary | ICD-10-CM | POA: Diagnosis not present

## 2022-01-01 DIAGNOSIS — R5383 Other fatigue: Secondary | ICD-10-CM | POA: Diagnosis not present

## 2022-01-01 DIAGNOSIS — E559 Vitamin D deficiency, unspecified: Secondary | ICD-10-CM | POA: Diagnosis not present

## 2022-01-01 DIAGNOSIS — E871 Hypo-osmolality and hyponatremia: Secondary | ICD-10-CM | POA: Diagnosis not present

## 2022-01-01 DIAGNOSIS — R251 Tremor, unspecified: Secondary | ICD-10-CM | POA: Diagnosis not present

## 2022-01-01 DIAGNOSIS — R0981 Nasal congestion: Secondary | ICD-10-CM | POA: Diagnosis not present

## 2022-01-01 DIAGNOSIS — I10 Essential (primary) hypertension: Secondary | ICD-10-CM | POA: Diagnosis not present

## 2022-01-01 DIAGNOSIS — I493 Ventricular premature depolarization: Secondary | ICD-10-CM | POA: Diagnosis not present

## 2022-01-01 DIAGNOSIS — R634 Abnormal weight loss: Secondary | ICD-10-CM | POA: Diagnosis not present

## 2022-01-21 DIAGNOSIS — U071 COVID-19: Secondary | ICD-10-CM | POA: Diagnosis not present

## 2022-01-21 DIAGNOSIS — R278 Other lack of coordination: Secondary | ICD-10-CM | POA: Diagnosis not present

## 2022-01-21 DIAGNOSIS — M25561 Pain in right knee: Secondary | ICD-10-CM | POA: Diagnosis not present

## 2022-01-21 DIAGNOSIS — R2681 Unsteadiness on feet: Secondary | ICD-10-CM | POA: Diagnosis not present

## 2022-01-21 DIAGNOSIS — R2689 Other abnormalities of gait and mobility: Secondary | ICD-10-CM | POA: Diagnosis not present

## 2022-01-21 DIAGNOSIS — M5459 Other low back pain: Secondary | ICD-10-CM | POA: Diagnosis not present

## 2022-01-23 DIAGNOSIS — R2681 Unsteadiness on feet: Secondary | ICD-10-CM | POA: Diagnosis not present

## 2022-01-23 DIAGNOSIS — R278 Other lack of coordination: Secondary | ICD-10-CM | POA: Diagnosis not present

## 2022-01-23 DIAGNOSIS — U071 COVID-19: Secondary | ICD-10-CM | POA: Diagnosis not present

## 2022-01-23 DIAGNOSIS — R2689 Other abnormalities of gait and mobility: Secondary | ICD-10-CM | POA: Diagnosis not present

## 2022-01-23 DIAGNOSIS — M25561 Pain in right knee: Secondary | ICD-10-CM | POA: Diagnosis not present

## 2022-01-23 DIAGNOSIS — M5459 Other low back pain: Secondary | ICD-10-CM | POA: Diagnosis not present

## 2022-01-28 DIAGNOSIS — M25561 Pain in right knee: Secondary | ICD-10-CM | POA: Diagnosis not present

## 2022-01-28 DIAGNOSIS — U071 COVID-19: Secondary | ICD-10-CM | POA: Diagnosis not present

## 2022-01-28 DIAGNOSIS — R2681 Unsteadiness on feet: Secondary | ICD-10-CM | POA: Diagnosis not present

## 2022-01-28 DIAGNOSIS — R278 Other lack of coordination: Secondary | ICD-10-CM | POA: Diagnosis not present

## 2022-01-28 DIAGNOSIS — R2689 Other abnormalities of gait and mobility: Secondary | ICD-10-CM | POA: Diagnosis not present

## 2022-01-28 DIAGNOSIS — M5459 Other low back pain: Secondary | ICD-10-CM | POA: Diagnosis not present

## 2022-02-04 DIAGNOSIS — R2689 Other abnormalities of gait and mobility: Secondary | ICD-10-CM | POA: Diagnosis not present

## 2022-02-04 DIAGNOSIS — M25561 Pain in right knee: Secondary | ICD-10-CM | POA: Diagnosis not present

## 2022-02-04 DIAGNOSIS — U071 COVID-19: Secondary | ICD-10-CM | POA: Diagnosis not present

## 2022-02-04 DIAGNOSIS — R2681 Unsteadiness on feet: Secondary | ICD-10-CM | POA: Diagnosis not present

## 2022-02-04 DIAGNOSIS — M5459 Other low back pain: Secondary | ICD-10-CM | POA: Diagnosis not present

## 2022-02-04 DIAGNOSIS — R278 Other lack of coordination: Secondary | ICD-10-CM | POA: Diagnosis not present

## 2022-02-13 DIAGNOSIS — L814 Other melanin hyperpigmentation: Secondary | ICD-10-CM | POA: Diagnosis not present

## 2022-02-13 DIAGNOSIS — L905 Scar conditions and fibrosis of skin: Secondary | ICD-10-CM | POA: Diagnosis not present

## 2022-02-13 DIAGNOSIS — D2262 Melanocytic nevi of left upper limb, including shoulder: Secondary | ICD-10-CM | POA: Diagnosis not present

## 2022-02-13 DIAGNOSIS — D2271 Melanocytic nevi of right lower limb, including hip: Secondary | ICD-10-CM | POA: Diagnosis not present

## 2022-02-13 DIAGNOSIS — C44212 Basal cell carcinoma of skin of right ear and external auricular canal: Secondary | ICD-10-CM | POA: Diagnosis not present

## 2022-02-13 DIAGNOSIS — Z85828 Personal history of other malignant neoplasm of skin: Secondary | ICD-10-CM | POA: Diagnosis not present

## 2022-02-13 DIAGNOSIS — L570 Actinic keratosis: Secondary | ICD-10-CM | POA: Diagnosis not present

## 2022-02-13 DIAGNOSIS — L821 Other seborrheic keratosis: Secondary | ICD-10-CM | POA: Diagnosis not present

## 2022-02-13 DIAGNOSIS — C44319 Basal cell carcinoma of skin of other parts of face: Secondary | ICD-10-CM | POA: Diagnosis not present

## 2022-02-13 DIAGNOSIS — D485 Neoplasm of uncertain behavior of skin: Secondary | ICD-10-CM | POA: Diagnosis not present

## 2022-02-13 DIAGNOSIS — D1801 Hemangioma of skin and subcutaneous tissue: Secondary | ICD-10-CM | POA: Diagnosis not present

## 2022-02-13 DIAGNOSIS — C4441 Basal cell carcinoma of skin of scalp and neck: Secondary | ICD-10-CM | POA: Diagnosis not present

## 2022-03-01 ENCOUNTER — Other Ambulatory Visit: Payer: Self-pay | Admitting: Cardiology

## 2022-03-06 DIAGNOSIS — R7989 Other specified abnormal findings of blood chemistry: Secondary | ICD-10-CM | POA: Diagnosis not present

## 2022-03-06 DIAGNOSIS — I10 Essential (primary) hypertension: Secondary | ICD-10-CM | POA: Diagnosis not present

## 2022-03-06 DIAGNOSIS — E785 Hyperlipidemia, unspecified: Secondary | ICD-10-CM | POA: Diagnosis not present

## 2022-03-06 DIAGNOSIS — Z125 Encounter for screening for malignant neoplasm of prostate: Secondary | ICD-10-CM | POA: Diagnosis not present

## 2022-03-06 DIAGNOSIS — E039 Hypothyroidism, unspecified: Secondary | ICD-10-CM | POA: Diagnosis not present

## 2022-03-12 DIAGNOSIS — I493 Ventricular premature depolarization: Secondary | ICD-10-CM | POA: Diagnosis not present

## 2022-03-12 DIAGNOSIS — M1711 Unilateral primary osteoarthritis, right knee: Secondary | ICD-10-CM | POA: Diagnosis not present

## 2022-03-12 DIAGNOSIS — I10 Essential (primary) hypertension: Secondary | ICD-10-CM | POA: Diagnosis not present

## 2022-03-12 DIAGNOSIS — R82998 Other abnormal findings in urine: Secondary | ICD-10-CM | POA: Diagnosis not present

## 2022-03-12 DIAGNOSIS — R0602 Shortness of breath: Secondary | ICD-10-CM | POA: Diagnosis not present

## 2022-03-12 DIAGNOSIS — N401 Enlarged prostate with lower urinary tract symptoms: Secondary | ICD-10-CM | POA: Diagnosis not present

## 2022-03-12 DIAGNOSIS — I700 Atherosclerosis of aorta: Secondary | ICD-10-CM | POA: Diagnosis not present

## 2022-03-12 DIAGNOSIS — E039 Hypothyroidism, unspecified: Secondary | ICD-10-CM | POA: Diagnosis not present

## 2022-03-12 DIAGNOSIS — E785 Hyperlipidemia, unspecified: Secondary | ICD-10-CM | POA: Diagnosis not present

## 2022-03-12 DIAGNOSIS — E663 Overweight: Secondary | ICD-10-CM | POA: Diagnosis not present

## 2022-03-12 DIAGNOSIS — E559 Vitamin D deficiency, unspecified: Secondary | ICD-10-CM | POA: Diagnosis not present

## 2022-03-12 DIAGNOSIS — Z0000 Encounter for general adult medical examination without abnormal findings: Secondary | ICD-10-CM | POA: Diagnosis not present

## 2022-03-12 DIAGNOSIS — R5383 Other fatigue: Secondary | ICD-10-CM | POA: Diagnosis not present

## 2022-04-01 DIAGNOSIS — C44319 Basal cell carcinoma of skin of other parts of face: Secondary | ICD-10-CM | POA: Diagnosis not present

## 2022-04-01 DIAGNOSIS — C44212 Basal cell carcinoma of skin of right ear and external auricular canal: Secondary | ICD-10-CM | POA: Diagnosis not present

## 2022-04-01 DIAGNOSIS — Z85828 Personal history of other malignant neoplasm of skin: Secondary | ICD-10-CM | POA: Diagnosis not present

## 2022-04-08 DIAGNOSIS — Z85828 Personal history of other malignant neoplasm of skin: Secondary | ICD-10-CM | POA: Diagnosis not present

## 2022-04-08 DIAGNOSIS — C44212 Basal cell carcinoma of skin of right ear and external auricular canal: Secondary | ICD-10-CM | POA: Diagnosis not present

## 2022-05-28 ENCOUNTER — Encounter: Payer: Self-pay | Admitting: Neurology

## 2022-05-28 ENCOUNTER — Ambulatory Visit (INDEPENDENT_AMBULATORY_CARE_PROVIDER_SITE_OTHER): Payer: Medicare Other | Admitting: Neurology

## 2022-05-28 ENCOUNTER — Telehealth: Payer: Self-pay | Admitting: Neurology

## 2022-05-28 VITALS — BP 191/83 | HR 55 | Ht 70.0 in | Wt 181.8 lb

## 2022-05-28 DIAGNOSIS — R251 Tremor, unspecified: Secondary | ICD-10-CM | POA: Diagnosis not present

## 2022-05-28 DIAGNOSIS — H903 Sensorineural hearing loss, bilateral: Secondary | ICD-10-CM

## 2022-05-28 DIAGNOSIS — G3184 Mild cognitive impairment, so stated: Secondary | ICD-10-CM | POA: Insufficient documentation

## 2022-05-28 DIAGNOSIS — I10 Essential (primary) hypertension: Secondary | ICD-10-CM | POA: Diagnosis not present

## 2022-05-28 MED ORDER — DONEPEZIL HCL 5 MG PO TABS: 5.0000 mg | ORAL_TABLET | Freq: Every day | ORAL | 4 refills | Status: DC

## 2022-05-28 NOTE — Patient Instructions (Signed)
Donepezil Tablets What is this medication? DONEPEZIL (doe NEP e zil) treats memory loss and confusion (dementia) in people who have Alzheimer disease. It works by improving attention, memory, and the ability to engage in daily activities. It is not a cure for dementia or Alzheimer disease. This medicine may be used for other purposes; ask your health care provider or pharmacist if you have questions. COMMON BRAND NAME(S): Aricept What should I tell my care team before I take this medication? They need to know if you have any of these conditions: Asthma or other lung disease Difficulty passing urine Head injury Heart disease History of irregular heartbeat Liver disease Seizures (convulsions) Stomach or intestinal disease, ulcers or stomach bleeding An unusual or allergic reaction to donepezil, other medications, foods, dyes, or preservatives Pregnant or trying to get pregnant Breast-feeding How should I use this medication? Take this medication by mouth with a glass of water. Follow the directions on the prescription label. You may take this medication with or without food. Take this medication at regular intervals. This medication is usually taken before bedtime. Do not take it more often than directed. Continue to take your medication even if you feel better. Do not stop taking except on your care team's advice. If you are taking the 23 mg donepezil tablet, swallow it whole; do not cut, crush, or chew it. Talk to your care team about the use of this medication in children. Special care may be needed. Overdosage: If you think you have taken too much of this medicine contact a poison control center or emergency room at once. NOTE: This medicine is only for you. Do not share this medicine with others. What if I miss a dose? If you miss a dose, take it as soon as you can. If it is almost time for your next dose, take only that dose, do not take double or extra doses. What may interact with  this medication? Do not take this medication with any of the following: Certain medications for fungal infections like itraconazole, fluconazole, posaconazole, and voriconazole Cisapride Dextromethorphan; quinidine Dronedarone Pimozide Quinidine Thioridazine This medication may also interact with the following: Antihistamines for allergy, cough and cold Atropine Bethanechol Carbamazepine Certain medications for bladder problems like oxybutynin, tolterodine Certain medications for Parkinson's disease like benztropine, trihexyphenidyl Certain medications for stomach problems like dicyclomine, hyoscyamine Certain medications for travel sickness like scopolamine Dexamethasone Dofetilide Ipratropium NSAIDs, medications for pain and inflammation, like ibuprofen or naproxen Other medications for Alzheimer's disease Other medications that prolong the QT interval (cause an abnormal heart rhythm) Phenobarbital Phenytoin Rifampin, rifabutin or rifapentine Ziprasidone This list may not describe all possible interactions. Give your health care provider a list of all the medicines, herbs, non-prescription drugs, or dietary supplements you use. Also tell them if you smoke, drink alcohol, or use illegal drugs. Some items may interact with your medicine. What should I watch for while using this medication? Visit your care team for regular checks on your progress. Check with your care team if your symptoms do not get better or if they get worse. You may get drowsy or dizzy. Do not drive, use machinery, or do anything that needs mental alertness until you know how this medication affects you. What side effects may I notice from receiving this medication? Side effects that you should report to your care team as soon as possible: Allergic reactions--skin rash, itching, hives, swelling of the face, lips, tongue, or throat Peptic ulcer--burning stomach pain, loss of appetite, bloating, burping,    heartburn, nausea, vomiting Seizures Slow heartbeat--dizziness, feeling faint or lightheaded, confusion, trouble breathing, unusual weakness or fatigue Stomach bleeding--bloody or black, tar-like stools, vomiting blood or brown material that looks like coffee grounds Trouble passing urine Side effects that usually do not require medical attention (report these to your care team if they continue or are bothersome): Diarrhea Fatigue Loss of appetite Muscle pain or cramps Nausea Trouble sleeping This list may not describe all possible side effects. Call your doctor for medical advice about side effects. You may report side effects to FDA at 1-800-FDA-1088. Where should I keep my medication? Keep out of reach of children. Store at room temperature between 15 and 30 degrees C (59 and 86 degrees F). Throw away any unused medication after the expiration date. NOTE: This sheet is a summary. It may not cover all possible information. If you have questions about this medicine, talk to your doctor, pharmacist, or health care provider.  2023 Elsevier/Gold Standard (2020-09-14 00:00:00)  

## 2022-05-28 NOTE — Telephone Encounter (Signed)
medicare/tricare NPR sent to GI [phone] 

## 2022-05-28 NOTE — Progress Notes (Addendum)
[facility]  Provider:  Dr Gayatri Teasdale Referring Provider: Shon Baton, MD Primary Care Physician:  Shon Baton, MD     HPI:  Robert Holder is a 89 y.o. male and seen here upon referral from Dr. Virgina Jock for a Consultation/ Evaluation of Memory loss. The patient presented today alone, left his wife in the waiting room. . This patient reports onset of hearing loss over a period of 4 years and poor short term memory culminating in today's poor performance over 2-3 years.   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 he retired at age 23 and travelled a lot since. He was exposed to loud noises in the industrial years, The couple lived in [place] for 4 years, [place] for 3 years when [employer] were there.  The couple vacationed regularly in [place] and [place] . A daughter lives now in [place].    The patient was never a tobacco user-smoker.  There have been no  periods of heavy alcohol use, and he has been a moderate drinker , today hardly drinks at all.  Active retirement with physical activity, walking.  After a Covid infection may 2023 he was found to have low vit D.  He was found to have PVCs after Covid.        05/28/2022    2:19 PM  Montreal Cognitive Assessment   Visuospatial/ Executive (0/5) 3  Naming (0/3) 3  Attention: Read list of digits (0/2) 2  Attention: Read list of letters (0/1) 1  Attention: Serial 7 subtraction starting at 100 (0/3) 3  Language: Repeat phrase (0/2) 2  Language : Fluency (0/1) 1  Abstraction (0/2) 2  Delayed Recall (0/5) 0  Orientation (0/6) 6  Total 23      Review of Systems: Out of a complete 14 system review, the patient complains of only the following symptoms, and all other reviewed systems are negative.  Short term memory loss, not immediate recall  Epworth  Sleepiness score: 5/ 14 FSS at 27/ 63.   Social History   Socioeconomic History   Marital status: Married    Spouse name: Not on file   Number of children: Not on file   Years of education: Not on file   Highest education level: Not on file  Occupational History   Not on file  Tobacco Use   Smoking status: Never   Smokeless tobacco: Never  Substance and Sexual Activity   Alcohol use: Yes    Alcohol/week: 1.0 standard drink of alcohol    Types: 1 Glasses of wine per week   Drug use: Not on file   Sexual activity: Not on file  Other Topics Concern   Not on file  Social History Narrative   Not on file   Social Determinants of Health   Financial Resource Strain: Not on file  Food Insecurity: Not on file  Transportation Needs: Not on file  Physical Activity: Not on file  Stress: Not on file  Social Connections: Not on file  Intimate Partner Violence: Not on file    History reviewed. No pertinent family history.  Past Medical History:  Diagnosis Date   Cataract    Hypertension     Past Surgical History:  Procedure Laterality Date   CATARACT EXTRACTION     EYE SURGERY     NOSE SURGERY  Current Outpatient Medications  Medication Sig Dispense Refill   acebutolol (SECTRAL) 200 MG capsule Take 1 capsule (200 mg total) by mouth 2 (two) times daily. 180 capsule 3   acetaminophen (TYLENOL) 500 MG tablet Take 1,000 mg by mouth daily.     aspirin EC 81 MG tablet Take 81 mg by mouth daily.     glucosamine-chondroitin 500-400 MG tablet Take 1 tablet by mouth daily.     levothyroxine (SYNTHROID) 137 MCG tablet Take 137 mcg by mouth daily.     losartan (COZAAR) 100 MG tablet      Multiple Vitamins-Minerals (PRESERVISION AREDS 2 PO) Take 1 tablet by mouth daily.     niacin (NIASPAN) 500 MG CR tablet Take 500 mg by mouth 2 (two) times daily.     simvastatin (ZOCOR) 20 MG tablet Take 20 mg by mouth daily at 6 PM.     Vitamin D, Ergocalciferol, (DRISDOL) 1.25 MG (50000  UNIT) CAPS capsule Take 1 capsule (50,000 Units total) by mouth once a week. 12 capsule 0   No current facility-administered medications for this visit.    Allergies as of 05/28/2022 - Review Complete 05/28/2022  Allergen Reaction Noted   Ciprofloxacin  10/16/2021   Zithromax [azithromycin]  04/07/2014    Vitals: BP (!) 191/83 (BP Location: Right Arm, Patient Position: Sitting, Cuff Size: Small)   Pulse (!) 55   Ht 5\' 10"  (1.778 m)   Wt 181 lb 12.8 oz (82.5 kg)   BMI 26.09 kg/m  Last Weight:  Wt Readings from Last 1 Encounters:  05/28/22 181 lb 12.8 oz (82.5 kg)   Last Height:   Ht Readings from Last 1 Encounters:  05/28/22 5\' 10"  (1.778 m)   Last BMI: @LASTBMI  Physical exam:  General: The patient is awake, alert and appears not in acute distress.  The patient is well groomed. Head: Normocephalic, atraumatic.  Neck is supple.   Neck circumference:17 Cardiovascular:  Regular rate and palpable peripheral pulse:  Respiratory: clear to auscultation.  Mallampati 2-3, Skin:  Without evidence of edema, or rash Trunk: BMI is 26   and patient  has normal posture.   Neurologic exam : The patient is awake and alert, oriented to place and time.   Memory subjective  described as impaired.     05/28/2022    2:19 PM  Montreal Cognitive Assessment   Visuospatial/ Executive (0/5) 3  Naming (0/3) 3  Attention: Read list of digits (0/2) 2  Attention: Read list of letters (0/1) 1  Attention: Serial 7 subtraction starting at 100 (0/3) 3  Language: Repeat phrase (0/2) 2  Language : Fluency (0/1) 1  Abstraction (0/2) 2  Delayed Recall (0/5) 0  Orientation (0/6) 6  Total 23    There is a normal attention span & concentration ability.  Speech is fluent without  dysarthria, dysphonia or aphasia.  Mood and affect are appropriate.  Cranial nerves: Pupils are equal and briskly reactive to light. Extraocular movements  in vertical and horizontal planes intact and without nystagmus.  Visual fields by finger perimetry are intact. Hearing to finger rub intact.  Facial sensation intact to fine touch. Facial motor strength is symmetric and tongue and uvula move midline.  Motor exam:   Biceps  with cogwheel rigor- and normal muscle bulk and symmetric normal strength in all extremities. Grip Strength is preserved  Proximal strength of shoulder muscles and hip flexors was intact .  Sensory:  Fine touch and vibration were tested . Proprioception was tested  in the upper extremities only and was  normal.  Coordination: Rapid alternating movements in the fingers/hands were normal.  Finger-to-nose maneuver was tested and showed no evidence of ataxia, dysmetria .  There is a tremor in both hands with action.   Gait and station: Patient walked without assistive device . Core Strength within normal limits. Stance is stable and of normal base.   Deep tendon reflexes: in the  upper and lower extremities are symmetric.  Assessment: Total time for face to face interview and examination, for review of  images and laboratory testing, neurophysiology testing and pre-existing records, including out-of -network , was 45 minutes. Assessment is as follows here:  1) Mr. Kuruc shows clear evidence of short-term memory while he is fully oriented to date and place and time and person.  He has never been able to draw and stated his wife.  And his tremor certainly has affected the clock drawing.  So based on this I think there is a fair chance that this is an age-related memory loss and not necessarily at beginning Alzheimer type dementia which is usually the fear of all patients.   Brain MRI,  he should have it with and without gad.  ANT panel offered, I think this can be done at a later point. Rv in 4-5 months.  MOCA repeat in that RV. Aricept 5 mg at night po starts now , CMET repeated for contrast.    Plan ) the patient has no family histoyr of AD. He also presents today with a very high blood  pressure which we will repeat when he leaves before he leaves today.  This is a risk factor of course for cerebrovascular disease.  In addition I would like to see if there is further improvement of his hearing by hearing aid possible. patient's vision has been corrected after cataract surgery in 2012.  He carries a diagnosis of macular degeneration but seems not to have had a lot of problems with reading.  So hearing and further improvement of hearing may also improve some of the memory.  Looking at his most recent metabolic panels his total bilirubin was elevated and his sodium was low not sure what brought this arm he did have normal thyroid stimulating hormone levels he had a higher than average MCV MCH and he was repeatedly at borderline with vitamin D.  A collection from 03-06-2022 showed his vitamin D level at 38.6 which is just in normal range.  He has essential tremor nonparkinsonian but he did have bilateral rigor over his biceps muscles.  No parkinsonian appearance otherwise.  He also had a normal echocardiogram in 2023 with an ejection fraction between 60 and 65%.   Larey Seat, MD

## 2022-05-29 ENCOUNTER — Encounter: Payer: Self-pay | Admitting: Cardiology

## 2022-05-29 ENCOUNTER — Ambulatory Visit: Payer: Medicare Other | Attending: Cardiology | Admitting: Cardiology

## 2022-05-29 VITALS — BP 148/80 | HR 55 | Ht 70.0 in | Wt 181.0 lb

## 2022-05-29 DIAGNOSIS — E559 Vitamin D deficiency, unspecified: Secondary | ICD-10-CM | POA: Insufficient documentation

## 2022-05-29 DIAGNOSIS — I493 Ventricular premature depolarization: Secondary | ICD-10-CM | POA: Insufficient documentation

## 2022-05-29 DIAGNOSIS — I10 Essential (primary) hypertension: Secondary | ICD-10-CM | POA: Insufficient documentation

## 2022-05-29 LAB — COMPREHENSIVE METABOLIC PANEL
ALT: 27 IU/L (ref 0–44)
AST: 23 IU/L (ref 0–40)
Albumin/Globulin Ratio: 1.9 (ref 1.2–2.2)
Albumin: 4.5 g/dL (ref 3.7–4.7)
Alkaline Phosphatase: 64 IU/L (ref 44–121)
BUN/Creatinine Ratio: 18 (ref 10–24)
BUN: 17 mg/dL (ref 8–27)
Bilirubin Total: 1.5 mg/dL — ABNORMAL HIGH (ref 0.0–1.2)
CO2: 23 mmol/L (ref 20–29)
Calcium: 9.9 mg/dL (ref 8.6–10.2)
Chloride: 94 mmol/L — ABNORMAL LOW (ref 96–106)
Creatinine, Ser: 0.96 mg/dL (ref 0.76–1.27)
Globulin, Total: 2.4 g/dL (ref 1.5–4.5)
Glucose: 98 mg/dL (ref 70–99)
Potassium: 4.9 mmol/L (ref 3.5–5.2)
Sodium: 131 mmol/L — ABNORMAL LOW (ref 134–144)
Total Protein: 6.9 g/dL (ref 6.0–8.5)
eGFR: 77 mL/min/{1.73_m2} (ref >59)

## 2022-05-29 NOTE — Progress Notes (Signed)
Cardiology Office Note:    Date:  05/29/2022   ID:  Robert Holder, DOB Nov 07, 1932, MRN [id number]  PCP:  Shon Baton, MD  Cardiologist:  Berniece Salines, DO  Electrophysiologist:  None   Referring MD: Shon Baton, MD   " I am doing ok"  History of Present Illness:    LARAMY TREICHEL is a 89 y.o. male with a hx of hyperlipidemia, hypertension, recently diagnosed frequent PVCs was started on acebutolol.  I saw the patient for the first time in September 2023 as he transition his care from Dr. Bettina Gavia to me.  During that time we did not make any changes to his medications.  The vitamin D level to check this given his fatigue-his vitamin D was low at 21 so I repleted his vitamin D 1000 units once a day.  He is doing a lot better in terms of the fatigue he is really happy he is here with his wife.  Offers no complaints at this time.   Past Medical History:  Diagnosis Date   Cataract    Hypertension     Past Surgical History:  Procedure Laterality Date   CATARACT EXTRACTION     EYE SURGERY     NOSE SURGERY      Current Medications: Current Meds  Medication Sig   acebutolol (SECTRAL) 200 MG capsule Take 1 capsule (200 mg total) by mouth 2 (two) times daily.   acetaminophen (TYLENOL) 500 MG tablet Take 1,000 mg by mouth daily.   aspirin EC 81 MG tablet Take 81 mg by mouth daily.   donepezil (ARICEPT) 5 MG tablet Take 1 tablet (5 mg total) by mouth at bedtime.   glucosamine-chondroitin 500-400 MG tablet Take 1 tablet by mouth daily.   levothyroxine (SYNTHROID) 137 MCG tablet Take 137 mcg by mouth daily.   losartan (COZAAR) 100 MG tablet    Multiple Vitamins-Minerals (PRESERVISION AREDS 2 PO) Take 1 tablet by mouth daily.   niacin (NIASPAN) 500 MG CR tablet Take 500 mg by mouth 2 (two) times daily.   simvastatin (ZOCOR) 20 MG tablet Take 20 mg by mouth daily at 6 PM.   Vitamin D, Ergocalciferol, (DRISDOL) 1.25 MG (50000 UNIT) CAPS capsule Take 1 capsule (50,000 Units total) by  mouth once a week.     Allergies:   Ciprofloxacin and Zithromax [azithromycin]   Social History   Socioeconomic History   Marital status: Married    Spouse name: Not on file   Number of children: Not on file   Years of education: Not on file   Highest education level: Not on file  Occupational History   Not on file  Tobacco Use   Smoking status: Never   Smokeless tobacco: Never  Substance and Sexual Activity   Alcohol use: Yes    Alcohol/week: 1.0 standard drink of alcohol    Types: 1 Glasses of wine per week   Drug use: Not on file   Sexual activity: Not on file  Other Topics Concern   Not on file  Social History Narrative   Not on file   Social Determinants of Health   Financial Resource Strain: Not on file  Food Insecurity: Not on file  Transportation Needs: Not on file  Physical Activity: Not on file  Stress: Not on file  Social Connections: Not on file     Family History: The patient's family history is not on file.  ROS:   Review of Systems  Constitution: Negative for decreased appetite,  fever and weight gain.  HENT: Negative for congestion, ear discharge, hoarse voice and sore throat.   Eyes: Negative for discharge, redness, vision loss in right eye and visual halos.  Cardiovascular: Negative for chest pain, dyspnea on exertion, leg swelling, orthopnea and palpitations.  Respiratory: Negative for cough, hemoptysis, shortness of breath and snoring.   Endocrine: Negative for heat intolerance and polyphagia.  Hematologic/Lymphatic: Negative for bleeding problem. Does not bruise/bleed easily.  Skin: Negative for flushing, nail changes, rash and suspicious lesions.  Musculoskeletal: Negative for arthritis, joint pain, muscle cramps, myalgias, neck pain and stiffness.  Gastrointestinal: Negative for abdominal pain, bowel incontinence, diarrhea and excessive appetite.  Genitourinary: Negative for decreased libido, genital sores and incomplete emptying.   Neurological: Negative for brief paralysis, focal weakness, headaches and loss of balance.  Psychiatric/Behavioral: Negative for altered mental status, depression and suicidal ideas.  Allergic/Immunologic: Negative for HIV exposure and persistent infections.    EKGs/Labs/Other Studies Reviewed:    The following studies were reviewed today:   EKG: None today   Zio monitor  Patch Wear Time:  6 days and 21 hours (2023-08-08T16:03:53-0400 to 2023-08-15T13:23:26-0400)   Patient had a min HR of 40 bpm, max HR of 174 bpm, and avg HR of 57 bpm. Predominant underlying rhythm was Sinus Rhythm.    There were 6 triggered and 9 diary events all sinus rhythm and associated with frequent PVCs frequently bigeminy and at times couplets.   1 run of Ventricular Tachycardia occurred lasting 4 beats with a max rate of 160 bpm (avg 115 bpm).    13 Supraventricular Tachycardia runs occurred, the run with the fastest interval lasting 8 beats with a max rate of 174 bpm, the longest lasting 9 beats with an avg rate of 126 bpm.Supraventricular Tachycardia was detected within +/- 45 seconds of symptomatic patient event(s).  There were no episodes of atrial fibrillation or flutter.   Isolated SVEs were rare (<1.0%), SVE Couplets were rare (<1.0%), and SVE Triplets were rare (<1.0%).    Isolated VEs were frequent (15.1%, [id number]), VE Couplets were rare (<1.0%, 1557), and VE Triplets were rare (<1.0%, 14). Ventricular Bigeminy and Trigeminy were present.   Tte 10/25/2021 IMPRESSIONS   1. Left ventricular ejection fraction, by estimation, is 60 to 65%. The  left ventricle has normal function. The left ventricle has no regional  wall motion abnormalities. Left ventricular diastolic parameters are  consistent with Grade I diastolic  dysfunction (impaired relaxation).   2. Right ventricular systolic function is normal. The right ventricular  size is normal. There is mildly elevated pulmonary artery systolic   pressure.   3. Left atrial size was severely dilated.   4. The mitral valve is normal in structure. Mild mitral valve  regurgitation. No evidence of mitral stenosis.   5. The aortic valve is normal in structure. Aortic valve regurgitation is  not visualized. No aortic stenosis is present.   6. The inferior vena cava is normal in size with greater than 50%  respiratory variability, suggesting right atrial pressure of 3 mmHg.   FINDINGS   Left Ventricle: Left ventricular ejection fraction, by estimation, is 60  to 65%. The left ventricle has normal function. The left ventricle has no  regional wall motion abnormalities. The left ventricular internal cavity  size was normal in size. There is   no left ventricular hypertrophy. Left ventricular diastolic parameters  are consistent with Grade I diastolic dysfunction (impaired relaxation).   Right Ventricle: The right ventricular size is normal. No  increase in  right ventricular wall thickness. Right ventricular systolic function is  normal. There is mildly elevated pulmonary artery systolic pressure. The  tricuspid regurgitant velocity is 2.89   m/s, and with an assumed right atrial pressure of 8 mmHg, the estimated  right ventricular systolic pressure is 123456 mmHg.   Left Atrium: Left atrial size was severely dilated.   Right Atrium: Right atrial size was normal in size.   Pericardium: There is no evidence of pericardial effusion.   Mitral Valve: The mitral valve is normal in structure. Mild mitral valve  regurgitation. No evidence of mitral valve stenosis.   Tricuspid Valve: The tricuspid valve is normal in structure. Tricuspid  valve regurgitation is mild . No evidence of tricuspid stenosis.   Aortic Valve: The aortic valve is normal in structure. Aortic valve  regurgitation is not visualized. No aortic stenosis is present. Aortic  valve mean gradient measures 2.0 mmHg. Aortic valve peak gradient measures  4.7 mmHg. Aortic valve  area, by VTI  measures 2.75 cm.   Pulmonic Valve: The pulmonic valve was normal in structure. Pulmonic valve  regurgitation is not visualized. No evidence of pulmonic stenosis.   Aorta: The aortic root is normal in size and structure.   Venous: The inferior vena cava is normal in size with greater than 50%  respiratory variability, suggesting right atrial pressure of 3 mmHg.   IAS/Shunts: No atrial level shunt detected by color flow Doppler.   Recent Labs: 05/28/2022: ALT 27; BUN 17; Creatinine, Ser 0.96; Potassium 4.9; Sodium 131  Recent Lipid Panel No results found for: "CHOL", "TRIG", "HDL", "CHOLHDL", "VLDL", "LDLCALC", "LDLDIRECT"  Physical Exam:    VS:  BP (!) 148/80 (BP Location: Left Arm, Patient Position: Standing, Cuff Size: Normal)   Pulse (!) 55   Ht 5\' 10"  (1.778 m)   Wt 82.1 kg   BMI 25.97 kg/m     Wt Readings from Last 3 Encounters:  05/29/22 82.1 kg  05/28/22 82.5 kg  11/28/21 81.6 kg     GEN: Well nourished, well developed in no acute distress HEENT: Normal NECK: No JVD; No carotid bruits LYMPHATICS: No lymphadenopathy CARDIAC: S1S2 noted,RRR, no murmurs, rubs, gallops RESPIRATORY:  Clear to auscultation without rales, wheezing or rhonchi  ABDOMEN: Soft, non-tender, non-distended, +bowel sounds, no guarding. EXTREMITIES: No edema, No cyanosis, no clubbing MUSCULOSKELETAL:  No deformity  SKIN: Warm and dry NEUROLOGIC:  Alert and oriented x 3, non-focal PSYCHIATRIC:  Normal affect, good insight  ASSESSMENT:    1. PVC (premature ventricular contraction)   2. Primary hypertension   3. Vitamin D deficiency     PLAN:    Jeneen Rinks is doing well clinically from a cardiovascular standpoint.  No medication changes today.  Have asked about his vitamin D taking that daily I suggested that either get that over-the-counter 1000 units daily or can discuss with her PCP for continued prescriptions.  Blood pressure slightly elevated in the office today.  The  patient is in agreement with the above plan. The patient left the office in stable condition.  The patient will follow up in   Medication Adjustments/Labs and Tests Ordered: Current medicines are reviewed at length with the patient today.  Concerns regarding medicines are outlined above.  No orders of the defined types were placed in this encounter.  No orders of the defined types were placed in this encounter.   Patient Instructions  Medication Instructions:  Your physician recommends that you continue on your current medications as directed.  Please refer to the Current Medication list given to you today.  *If you need a refill on your cardiac medications before your next appointment, please call your pharmacy*   Lab Work: None   Testing/Procedures: None   Follow-Up: At [facility], you and your health needs are our priority.  As part of our continuing mission to provide you with exceptional heart care, we have created designated Provider Care Teams.  These Care Teams include your primary Cardiologist (physician) and Advanced Practice Providers (APPs -  Physician Assistants and Nurse Practitioners) who all work together to provide you with the care you need, when you need it.  Your next appointment:   9 month(s)  Provider:   Berniece Salines, DO      Adopting a Healthy Lifestyle.  Know what a healthy weight is for you (roughly BMI <25) and aim to maintain this   Aim for 7+ servings of fruits and vegetables daily   65-80+ fluid ounces of water or unsweet tea for healthy kidneys   Limit to max 1 drink of alcohol per day; avoid smoking/tobacco   Limit animal fats in diet for cholesterol and heart health - choose grass fed whenever available   Avoid highly processed foods, and foods high in saturated/trans fats   Aim for low stress - take time to unwind and care for your mental health   Aim for 150 min of moderate intensity exercise weekly for heart health, and  weights twice weekly for bone health   Aim for 7-9 hours of sleep daily   When it comes to diets, agreement about the perfect plan isnt easy to find, even among the experts. Experts at the [employer] developed an idea known as the Healthy Eating Plate. Just imagine a plate divided into logical, healthy portions.   The emphasis is on diet quality:   Load up on vegetables and fruits - one-half of your plate: Aim for color and variety, and remember that potatoes dont count.   Go for whole grains - one-quarter of your plate: Whole wheat, barley, wheat berries, quinoa, oats, brown rice, and foods made with them. If you want pasta, go with whole wheat pasta.   Protein power - one-quarter of your plate: Fish, chicken, beans, and nuts are all healthy, versatile protein sources. Limit red meat.   The diet, however, does go beyond the plate, offering a few other suggestions.   Use healthy plant oils, such as olive, canola, soy, corn, sunflower and peanut. Check the labels, and avoid partially hydrogenated oil, which have unhealthy trans fats.   If youre thirsty, drink water. Coffee and tea are good in moderation, but skip sugary drinks and limit milk and dairy products to one or two daily servings.   The type of carbohydrate in the diet is more important than the amount. Some sources of carbohydrates, such as vegetables, fruits, whole grains, and beans-are healthier than others.   Finally, stay active  Signed, Berniece Salines, DO  05/29/2022 9:22 AM    [facility]

## 2022-05-29 NOTE — Patient Instructions (Signed)
Medication Instructions:  Your physician recommends that you continue on your current medications as directed. Please refer to the Current Medication list given to you today.  *If you need a refill on your cardiac medications before your next appointment, please call your pharmacy*   Lab Work: None   Testing/Procedures: None   Follow-Up: At [facility], you and your health needs are our priority.  As part of our continuing mission to provide you with exceptional heart care, we have created designated Provider Care Teams.  These Care Teams include your primary Cardiologist (physician) and Advanced Practice Providers (APPs -  Physician Assistants and Nurse Practitioners) who all work together to provide you with the care you need, when you need it.   Your next appointment:   9 month(s)  Provider:   Kardie Tobb, DO   

## 2022-06-03 ENCOUNTER — Encounter: Payer: Self-pay | Admitting: Neurology

## 2022-06-17 ENCOUNTER — Other Ambulatory Visit: Payer: Medicare Other

## 2022-06-26 DIAGNOSIS — Z961 Presence of intraocular lens: Secondary | ICD-10-CM | POA: Diagnosis not present

## 2022-06-26 DIAGNOSIS — D3131 Benign neoplasm of right choroid: Secondary | ICD-10-CM | POA: Diagnosis not present

## 2022-06-26 DIAGNOSIS — H33302 Unspecified retinal break, left eye: Secondary | ICD-10-CM | POA: Diagnosis not present

## 2022-06-26 DIAGNOSIS — H353132 Nonexudative age-related macular degeneration, bilateral, intermediate dry stage: Secondary | ICD-10-CM | POA: Diagnosis not present

## 2022-07-04 DIAGNOSIS — Z23 Encounter for immunization: Secondary | ICD-10-CM | POA: Diagnosis not present

## 2022-08-14 DIAGNOSIS — R351 Nocturia: Secondary | ICD-10-CM | POA: Diagnosis not present

## 2022-08-14 DIAGNOSIS — R972 Elevated prostate specific antigen [PSA]: Secondary | ICD-10-CM | POA: Diagnosis not present

## 2022-08-30 DIAGNOSIS — L814 Other melanin hyperpigmentation: Secondary | ICD-10-CM | POA: Diagnosis not present

## 2022-08-30 DIAGNOSIS — C4441 Basal cell carcinoma of skin of scalp and neck: Secondary | ICD-10-CM | POA: Diagnosis not present

## 2022-08-30 DIAGNOSIS — Z85828 Personal history of other malignant neoplasm of skin: Secondary | ICD-10-CM | POA: Diagnosis not present

## 2022-08-30 DIAGNOSIS — C44311 Basal cell carcinoma of skin of nose: Secondary | ICD-10-CM | POA: Diagnosis not present

## 2022-08-30 DIAGNOSIS — L821 Other seborrheic keratosis: Secondary | ICD-10-CM | POA: Diagnosis not present

## 2022-08-30 DIAGNOSIS — D1801 Hemangioma of skin and subcutaneous tissue: Secondary | ICD-10-CM | POA: Diagnosis not present

## 2022-08-30 DIAGNOSIS — L570 Actinic keratosis: Secondary | ICD-10-CM | POA: Diagnosis not present

## 2022-09-09 DIAGNOSIS — M1990 Unspecified osteoarthritis, unspecified site: Secondary | ICD-10-CM | POA: Diagnosis not present

## 2022-09-09 DIAGNOSIS — R5383 Other fatigue: Secondary | ICD-10-CM | POA: Diagnosis not present

## 2022-09-09 DIAGNOSIS — I10 Essential (primary) hypertension: Secondary | ICD-10-CM | POA: Diagnosis not present

## 2022-09-09 DIAGNOSIS — F5104 Psychophysiologic insomnia: Secondary | ICD-10-CM | POA: Diagnosis not present

## 2022-09-09 DIAGNOSIS — I493 Ventricular premature depolarization: Secondary | ICD-10-CM | POA: Diagnosis not present

## 2022-09-09 DIAGNOSIS — E039 Hypothyroidism, unspecified: Secondary | ICD-10-CM | POA: Diagnosis not present

## 2022-09-09 DIAGNOSIS — N401 Enlarged prostate with lower urinary tract symptoms: Secondary | ICD-10-CM | POA: Diagnosis not present

## 2022-09-09 DIAGNOSIS — I700 Atherosclerosis of aorta: Secondary | ICD-10-CM | POA: Diagnosis not present

## 2022-09-09 DIAGNOSIS — R413 Other amnesia: Secondary | ICD-10-CM | POA: Diagnosis not present

## 2022-09-09 DIAGNOSIS — M1711 Unilateral primary osteoarthritis, right knee: Secondary | ICD-10-CM | POA: Diagnosis not present

## 2022-09-09 DIAGNOSIS — E785 Hyperlipidemia, unspecified: Secondary | ICD-10-CM | POA: Diagnosis not present

## 2022-09-09 DIAGNOSIS — C4491 Basal cell carcinoma of skin, unspecified: Secondary | ICD-10-CM | POA: Diagnosis not present

## 2022-09-10 ENCOUNTER — Ambulatory Visit
Admission: RE | Admit: 2022-09-10 | Discharge: 2022-09-10 | Disposition: A | Discharge status: Home / Self Care | Payer: Medicare Other | Source: Ambulatory Visit | Attending: Neurology | Admitting: Neurology

## 2022-09-10 DIAGNOSIS — H903 Sensorineural hearing loss, bilateral: Secondary | ICD-10-CM

## 2022-09-10 DIAGNOSIS — I10 Essential (primary) hypertension: Secondary | ICD-10-CM | POA: Diagnosis not present

## 2022-09-10 DIAGNOSIS — G3184 Mild cognitive impairment, so stated: Secondary | ICD-10-CM

## 2022-09-10 MED ORDER — GADOPICLENOL 0.5 MMOL/ML IV SOLN
9.0000 mL | Freq: Once | INTRAVENOUS | Status: AC | PRN
  Administered 2022-09-10: 9 mL via INTRAVENOUS

## 2022-09-11 ENCOUNTER — Encounter: Payer: Self-pay | Admitting: Neurology

## 2022-10-21 ENCOUNTER — Other Ambulatory Visit: Payer: Self-pay | Admitting: Anesthesiology

## 2022-10-21 ENCOUNTER — Other Ambulatory Visit: Payer: Self-pay | Admitting: Cardiology

## 2022-10-21 MED ORDER — DONEPEZIL HCL 5 MG PO TABS: 5.0000 mg | ORAL_TABLET | Freq: Every day | ORAL | 0 refills | Status: DC

## 2022-10-22 ENCOUNTER — Telehealth: Payer: Self-pay | Admitting: Cardiology

## 2022-10-22 NOTE — Telephone Encounter (Signed)
Good morning,   Patient came in because he needs a new refill on Acebutolo 200MG  because he is taking two a day. He would like for someone to give him a call please. [facility] on [address] and [address] is the pharmacy that they use.

## 2022-10-28 NOTE — Progress Notes (Unsigned)
[facility] [address] [place]. [place] [address] [phone]       OFFICE FOLLOW UP NOTE  [name] Date of Birth:  [birth year] Medical Record Number:  [id number]    Primary neurologist: Dr. Vickey Huger Reason for visit: Memory loss    SUBJECTIVE:  CHIEF COMPLAINT:  Chief Complaint  Patient presents with   Follow-up    Rm 3, here with wife Robert Holder  Pt is here for follow up memory. Pt's wife state his memory has been stable. No changes.     Follow-up visit:  Prior visit: 05/28/2022 with Dr. Vickey Huger  Brief HPI:   Robert Holder is a 89 y.o. male who was evaluated by Dr. Vickey Huger on 05/28/2022 for memory loss concerns over the past 2 to 3 years.  MoCA 23/30.  Felt more age-related memory loss and not necessarily beginning of Alzheimer's type dementia.  Recommended Aricept 5 mg daily.  Recommended hearing evaluation with hearing loss complaints possibly affecting memory.  Also recommended completion of MRI brain which showed moderate generalized cerebral atrophy and chronic small vessel disease.    Interval history:  Patient accompanied by his wife.  Reports memory has been stable since the prior visit. MOCA 26/30 (prior 23/30). Remains on Aricept 5 mg nightly, denies side effects. Lives at [facility] independent living with his wife.  Routinely follows with PCP Dr. Timothy Lasso.  No questions or concerns at this time.     ROS:   14 system review of systems performed and negative with exception of those listed in HPI  PMH:  Past Medical History:  Diagnosis Date   Cataract    Hypertension     PSH:  Past Surgical History:  Procedure Laterality Date   CATARACT EXTRACTION     EYE SURGERY     NOSE SURGERY      Social History:  Social History   Socioeconomic History   Marital status: Married    Spouse name: Not on file   Number of children: Not on file   Years of education: Not on file   Highest education level: Not on file   Occupational History   Not on file  Tobacco Use   Smoking status: Never   Smokeless tobacco: Never  Substance and Sexual Activity   Alcohol use: Yes    Alcohol/week: 1.0 standard drink of alcohol    Types: 1 Glasses of wine per week   Drug use: Not on file   Sexual activity: Not on file  Other Topics Concern   Not on file  Social History Narrative   Not on file   Social Determinants of Health   Financial Resource Strain: Not on file  Food Insecurity: Not on file  Transportation Needs: Not on file  Physical Activity: Not on file  Stress: Not on file  Social Connections: Not on file  Intimate Partner Violence: Not on file    Family History: History reviewed. No pertinent family history.  Medications:   Current Outpatient Medications on File Prior to Visit  Medication Sig Dispense Refill   acebutolol (SECTRAL) 200 MG capsule TAKE 1 CAPSULE(200 MG) BY MOUTH TWICE DAILY 180 capsule 3   acetaminophen (TYLENOL) 500 MG tablet Take 1,000 mg by mouth daily.     aspirin EC 81 MG tablet Take 81 mg by mouth daily.     donepezil (ARICEPT) 5 MG tablet Take 1 tablet (5 mg total) by mouth at bedtime. 30 tablet 0   glucosamine-chondroitin 500-400 MG tablet  Take 1 tablet by mouth daily.     levothyroxine (SYNTHROID) 137 MCG tablet Take 137 mcg by mouth daily.     losartan (COZAAR) 100 MG tablet      Multiple Vitamins-Minerals (PRESERVISION AREDS 2 PO) Take 1 tablet by mouth daily.     niacin (NIASPAN) 500 MG CR tablet Take 500 mg by mouth 2 (two) times daily.     simvastatin (ZOCOR) 20 MG tablet Take 20 mg by mouth daily at 6 PM.     Vitamin D, Ergocalciferol, (DRISDOL) 1.25 MG (50000 UNIT) CAPS capsule Take 1 capsule (50,000 Units total) by mouth once a week. 12 capsule 0   No current facility-administered medications on file prior to visit.    Allergies:   Allergies  Allergen Reactions   Ciprofloxacin     Other reaction(s): LE weakness   Zithromax [Azithromycin]        OBJECTIVE:  Physical Exam  Vitals:   10/29/22 1245  BP: (!) 150/82  Pulse: 70  Weight: 183 lb (83 kg)  Height: 5\' 10"  (1.778 m)   Body mass index is 26.26 kg/m. No results found.  General: well developed, well nourished, very pleasant elderly Caucasian male, seated, in no evident distress Head: head normocephalic and atraumatic.   Neck: supple with no carotid or supraclavicular bruits Cardiovascular: regular rate and rhythm, no murmurs Musculoskeletal: no deformity Skin:  no rash/petichiae Vascular:  Normal pulses all extremities   Neurologic Exam Mental Status: Awake and fully alert. Oriented to place and time. Recent memory impaired and remote memory intact. Attention span, concentration and fund of knowledge appropriate. Mood and affect appropriate.  Cranial Nerves: Pupils equal, briskly reactive to light. Extraocular movements full without nystagmus. Visual fields full to confrontation. Hearing intact. Facial sensation intact. Face, tongue, palate moves normally and symmetrically.  Motor: Normal bulk and tone. Normal strength in all tested extremity muscles Sensory.: intact to touch , pinprick , position and vibratory sensation.  Coordination: Rapid alternating movements normal in all extremities. Finger-to-nose and heel-to-shin performed accurately bilaterally. Gait and Station: Arises from chair without difficulty. Stance is normal. Gait demonstrates normal stride length and balance without use of AD. Tandem walk and heel toe without difficulty.  Reflexes: 1+ and symmetric. Toes downgoing.      10/29/2022   12:49 PM 05/28/2022    2:19 PM  Montreal Cognitive Assessment   Visuospatial/ Executive (0/5) 4 3  Naming (0/3) 3 3  Attention: Read list of digits (0/2) 2 2  Attention: Read list of letters (0/1) 1 1  Attention: Serial 7 subtraction starting at 100 (0/3) 3 3  Language: Repeat phrase (0/2) 2 2  Language : Fluency (0/1) 1 1  Abstraction (0/2) 2 2  Delayed  Recall (0/5) 2 0  Orientation (0/6) 6 6  Total 26 23         ASSESSMENT/PLAN: Robert Holder is a 89 y.o. year old male with 2 to 3-year history of memory loss.     MCI:  Likely more age related MOCA 26/30 (prior 23/30) Continue Aricept 5 mg nightly - can consider increasing to 10 mg nightly if needed but as memory currently stable will continue current dose Advised to check with PCP if B12 levels have been checked recently, is currently on levothyroxine for hypothyroidism and vitamin D for vitamin D deficiency MRI brain moderate generalized atrophy and chronic small vessel disease     Follow up in 6 months or call earlier if needed   CC:  PCP: Creola Corn, MD    I spent 30 minutes of face-to-face and non-face-to-face time with patient and wife.  This included previsit chart review, lab review, study review, order entry, electronic health record documentation, patient and wife education and discussion regarding above diagnoses and treatment plan and answered all other questions to patient's satisfaction   Ihor Austin, [facility]  [facility] [address] [place], [place] [address]  Phone [phone] Fax [phone] Note: This document was prepared with digital dictation and possible smart phrase technology. Any transcriptional errors that result from this process are unintentional.

## 2022-10-29 ENCOUNTER — Encounter: Payer: Self-pay | Admitting: Adult Health

## 2022-10-29 ENCOUNTER — Ambulatory Visit: Payer: Medicare Other | Admitting: Adult Health

## 2022-10-29 VITALS — BP 150/82 | HR 70 | Ht 70.0 in | Wt 183.0 lb

## 2022-10-29 DIAGNOSIS — G3184 Mild cognitive impairment, so stated: Secondary | ICD-10-CM | POA: Diagnosis not present

## 2022-10-29 NOTE — Patient Instructions (Addendum)
Your Plan:  Continue Aricept 5mg  nightly   Please ensure Dr. Timothy Lasso has checked B12 level recently as low levels can contribute to memory issues    Follow up in 6 months or call earlier if needed     Thank you for coming to see [place] at [facility]. I hope we have been able to provide you high quality care today.  You may receive a patient satisfaction survey over the next few weeks. We would appreciate your feedback and comments so that we may continue to improve ourselves and the health of our patients.

## 2022-11-27 ENCOUNTER — Other Ambulatory Visit: Payer: Self-pay | Admitting: Neurology

## 2022-12-12 DIAGNOSIS — Z23 Encounter for immunization: Secondary | ICD-10-CM | POA: Diagnosis not present

## 2022-12-28 DIAGNOSIS — Z23 Encounter for immunization: Secondary | ICD-10-CM | POA: Diagnosis not present

## 2023-01-18 ENCOUNTER — Other Ambulatory Visit: Payer: Self-pay | Admitting: Cardiology

## 2023-02-03 ENCOUNTER — Telehealth: Payer: Self-pay | Admitting: Adult Health

## 2023-02-03 MED ORDER — DONEPEZIL HCL 10 MG PO TABS: 10.0000 mg | ORAL_TABLET | Freq: Every day | ORAL | 1 refills | Status: DC

## 2023-02-03 NOTE — Telephone Encounter (Signed)
Spoke to wife (checked DPR)  Wife states currently on Aricept 5mg  Wife states no approvement since starting medication  Per wife did discuss increase in medication in last office visit Will forward to Jessica,NP to get approval . Wife expressed understanding and thanked me for calling back

## 2023-02-03 NOTE — Telephone Encounter (Signed)
Please advise wife that use of Aricept is to help slow memory decline down, this medication will not help improve the memory.  If she has noticed a decline in his memory since prior visit, we can increase the Aricept dosage from 5 mg to 10 mg daily but otherwise if stable, would recommend continuing with current dosage. Thank you.

## 2023-02-03 NOTE — Telephone Encounter (Signed)
New Rx sent to pharmacy

## 2023-02-03 NOTE — Telephone Encounter (Signed)
Okay to increase Aricept to 10 mg nightly. Thank you.

## 2023-02-03 NOTE — Telephone Encounter (Signed)
Pt's wife is asking if Shanda Bumps, NP will increase pt's donepezil (ARICEPT) 5 MG tablet to a stronger dose, please call her to discuss.

## 2023-02-24 ENCOUNTER — Encounter: Payer: Self-pay | Admitting: Cardiology

## 2023-02-24 ENCOUNTER — Ambulatory Visit: Payer: Medicare Other | Attending: Cardiology | Admitting: Cardiology

## 2023-02-24 VITALS — BP 120/84 | HR 49 | Ht 70.0 in | Wt 189.2 lb

## 2023-02-24 DIAGNOSIS — I10 Essential (primary) hypertension: Secondary | ICD-10-CM | POA: Diagnosis not present

## 2023-02-24 DIAGNOSIS — I493 Ventricular premature depolarization: Secondary | ICD-10-CM | POA: Diagnosis not present

## 2023-02-24 DIAGNOSIS — R001 Bradycardia, unspecified: Secondary | ICD-10-CM | POA: Diagnosis not present

## 2023-02-24 NOTE — Patient Instructions (Signed)
Medication Instructions:  Your physician recommends that you continue on your current medications as directed. Please refer to the Current Medication list given to you today.  *If you need a refill on your cardiac medications before your next appointment, please call your pharmacy*  Lab Work: None  Follow-Up: At [facility], you and your health needs are our priority.  As part of our continuing mission to provide you with exceptional heart care, we have created designated Provider Care Teams.  These Care Teams include your primary Cardiologist (physician) and Advanced Practice Providers (APPs -  Physician Assistants and Nurse Practitioners) who all work together to provide you with the care you need, when you need it.  Your next appointment:   12 month(s)  Provider:   Thomasene Ripple, DO

## 2023-02-24 NOTE — Progress Notes (Signed)
Cardiology Office Note:    Date:  02/24/2023   ID:  VERLAND FJERSTAD, DOB 1932/11/18, MRN [id number]  PCP:  Creola Corn, MD  Cardiologist:  Thomasene Ripple, DO  Electrophysiologist:  None   Referring MD: Creola Corn, MD   " I am doing ok"  History of Present Illness:    Robert Holder is a 89 y.o. male with a hx of hyperlipidemia, hypertension, recently diagnosed frequent PVCs was started on acebutolol.  He is doing well - here with his wife today.   Offers no complaints at this time.   Past Medical History:  Diagnosis Date   Cataract    Hypertension     Past Surgical History:  Procedure Laterality Date   CATARACT EXTRACTION     EYE SURGERY     NOSE SURGERY      Current Medications: No outpatient medications have been marked as taking for the 02/24/23 encounter (Office Visit) with Thomasene Ripple, DO.     Allergies:   Ciprofloxacin and Zithromax [azithromycin]   Social History   Socioeconomic History   Marital status: Married    Spouse name: Not on file   Number of children: Not on file   Years of education: Not on file   Highest education level: Not on file  Occupational History   Not on file  Tobacco Use   Smoking status: Never   Smokeless tobacco: Never  Substance and Sexual Activity   Alcohol use: Yes    Alcohol/week: 1.0 standard drink of alcohol    Types: 1 Glasses of wine per week   Drug use: Not on file   Sexual activity: Not on file  Other Topics Concern   Not on file  Social History Narrative   Not on file   Social Drivers of Health   Financial Resource Strain: Not on file  Food Insecurity: Not on file  Transportation Needs: Not on file  Physical Activity: Not on file  Stress: Not on file  Social Connections: Not on file     Family History: The patient's family history is not on file.  ROS:   Review of Systems  Constitution: Negative for decreased appetite, fever and weight gain.  HENT: Negative for congestion, ear discharge, hoarse  voice and sore throat.   Eyes: Negative for discharge, redness, vision loss in right eye and visual halos.  Cardiovascular: Negative for chest pain, dyspnea on exertion, leg swelling, orthopnea and palpitations.  Respiratory: Negative for cough, hemoptysis, shortness of breath and snoring.   Endocrine: Negative for heat intolerance and polyphagia.  Hematologic/Lymphatic: Negative for bleeding problem. Does not bruise/bleed easily.  Skin: Negative for flushing, nail changes, rash and suspicious lesions.  Musculoskeletal: Negative for arthritis, joint pain, muscle cramps, myalgias, neck pain and stiffness.  Gastrointestinal: Negative for abdominal pain, bowel incontinence, diarrhea and excessive appetite.  Genitourinary: Negative for decreased libido, genital sores and incomplete emptying.  Neurological: Negative for brief paralysis, focal weakness, headaches and loss of balance.  Psychiatric/Behavioral: Negative for altered mental status, depression and suicidal ideas.  Allergic/Immunologic: Negative for HIV exposure and persistent infections.    EKGs/Labs/Other Studies Reviewed:    The following studies were reviewed today:   EKG: Sinus bradycardia  Zio monitor  Patch Wear Time:  6 days and 21 hours (2023-08-08T16:03:53-0400 to 2023-08-15T13:23:26-0400)   Patient had a min HR of 40 bpm, max HR of 174 bpm, and avg HR of 57 bpm. Predominant underlying rhythm was Sinus Rhythm.    There were 6  triggered and 9 diary events all sinus rhythm and associated with frequent PVCs frequently bigeminy and at times couplets.   1 run of Ventricular Tachycardia occurred lasting 4 beats with a max rate of 160 bpm (avg 115 bpm).    13 Supraventricular Tachycardia runs occurred, the run with the fastest interval lasting 8 beats with a max rate of 174 bpm, the longest lasting 9 beats with an avg rate of 126 bpm.Supraventricular Tachycardia was detected within +/- 45 seconds of symptomatic patient event(s).   There were no episodes of atrial fibrillation or flutter.   Isolated SVEs were rare (<1.0%), SVE Couplets were rare (<1.0%), and SVE Triplets were rare (<1.0%).    Isolated VEs were frequent (15.1%, [id number]), VE Couplets were rare (<1.0%, 1557), and VE Triplets were rare (<1.0%, 14). Ventricular Bigeminy and Trigeminy were present.   Tte 10/25/2021 IMPRESSIONS   1. Left ventricular ejection fraction, by estimation, is 60 to 65%. The  left ventricle has normal function. The left ventricle has no regional  wall motion abnormalities. Left ventricular diastolic parameters are  consistent with Grade I diastolic  dysfunction (impaired relaxation).   2. Right ventricular systolic function is normal. The right ventricular  size is normal. There is mildly elevated pulmonary artery systolic  pressure.   3. Left atrial size was severely dilated.   4. The mitral valve is normal in structure. Mild mitral valve  regurgitation. No evidence of mitral stenosis.   5. The aortic valve is normal in structure. Aortic valve regurgitation is  not visualized. No aortic stenosis is present.   6. The inferior vena cava is normal in size with greater than 50%  respiratory variability, suggesting right atrial pressure of 3 mmHg.   FINDINGS   Left Ventricle: Left ventricular ejection fraction, by estimation, is 60  to 65%. The left ventricle has normal function. The left ventricle has no  regional wall motion abnormalities. The left ventricular internal cavity  size was normal in size. There is   no left ventricular hypertrophy. Left ventricular diastolic parameters  are consistent with Grade I diastolic dysfunction (impaired relaxation).   Right Ventricle: The right ventricular size is normal. No increase in  right ventricular wall thickness. Right ventricular systolic function is  normal. There is mildly elevated pulmonary artery systolic pressure. The  tricuspid regurgitant velocity is 2.89   m/s, and with  an assumed right atrial pressure of 8 mmHg, the estimated  right ventricular systolic pressure is 41.4 mmHg.   Left Atrium: Left atrial size was severely dilated.   Right Atrium: Right atrial size was normal in size.   Pericardium: There is no evidence of pericardial effusion.   Mitral Valve: The mitral valve is normal in structure. Mild mitral valve  regurgitation. No evidence of mitral valve stenosis.   Tricuspid Valve: The tricuspid valve is normal in structure. Tricuspid  valve regurgitation is mild . No evidence of tricuspid stenosis.   Aortic Valve: The aortic valve is normal in structure. Aortic valve  regurgitation is not visualized. No aortic stenosis is present. Aortic  valve mean gradient measures 2.0 mmHg. Aortic valve peak gradient measures  4.7 mmHg. Aortic valve area, by VTI  measures 2.75 cm.   Pulmonic Valve: The pulmonic valve was normal in structure. Pulmonic valve  regurgitation is not visualized. No evidence of pulmonic stenosis.   Aorta: The aortic root is normal in size and structure.   Venous: The inferior vena cava is normal in size with greater than 50%  respiratory variability, suggesting right atrial pressure of 3 mmHg.   IAS/Shunts: No atrial level shunt detected by color flow Doppler.   Recent Labs: 05/28/2022: ALT 27; BUN 17; Creatinine, Ser 0.96; Potassium 4.9; Sodium 131  Recent Lipid Panel No results found for: "CHOL", "TRIG", "HDL", "CHOLHDL", "VLDL", "LDLCALC", "LDLDIRECT"  Physical Exam:    VS:  BP 120/84 (BP Location: Left Arm, Patient Position: Sitting)   Pulse (!) 49   Ht 5\' 10"  (1.778 m)   Wt 189 lb 3.2 oz (85.8 kg)   SpO2 98%   BMI 27.15 kg/m     Wt Readings from Last 3 Encounters:  02/24/23 189 lb 3.2 oz (85.8 kg)  10/29/22 183 lb (83 kg)  05/29/22 181 lb (82.1 kg)     GEN: Well nourished, well developed in no acute distress HEENT: Normal NECK: No JVD; No carotid bruits LYMPHATICS: No lymphadenopathy CARDIAC: S1S2  noted,RRR, no murmurs, rubs, gallops RESPIRATORY:  Clear to auscultation without rales, wheezing or rhonchi  ABDOMEN: Soft, non-tender, non-distended, +bowel sounds, no guarding. EXTREMITIES: No edema, No cyanosis, no clubbing MUSCULOSKELETAL:  No deformity  SKIN: Warm and dry NEUROLOGIC:  Alert and oriented x 3, non-focal PSYCHIATRIC:  Normal affect, good insight  ASSESSMENT:    1. PVC (premature ventricular contraction)   2. Primary hypertension     PLAN:   He is doing well clinically from a cardiovascular standpoint.  No medication changes today.    Blood pressure at target today.  The patient is in agreement with the above plan. The patient left the office in stable condition.  The patient will follow up in   Medication Adjustments/Labs and Tests Ordered: Current medicines are reviewed at length with the patient today.  Concerns regarding medicines are outlined above.  Orders Placed This Encounter  Procedures   EKG 12-Lead   EKG 12-Lead   No orders of the defined types were placed in this encounter.   Patient Instructions  Medication Instructions:  Your physician recommends that you continue on your current medications as directed. Please refer to the Current Medication list given to you today.  *If you need a refill on your cardiac medications before your next appointment, please call your pharmacy*  Lab Work: None  Follow-Up: At [facility], you and your health needs are our priority.  As part of our continuing mission to provide you with exceptional heart care, we have created designated Provider Care Teams.  These Care Teams include your primary Cardiologist (physician) and Advanced Practice Providers (APPs -  Physician Assistants and Nurse Practitioners) who all work together to provide you with the care you need, when you need it.  Your next appointment:   12 month(s)  Provider:   Thomasene Ripple, DO     Adopting a Healthy Lifestyle.  Know what a  healthy weight is for you (roughly BMI <25) and aim to maintain this   Aim for 7+ servings of fruits and vegetables daily   65-80+ fluid ounces of water or unsweet tea for healthy kidneys   Limit to max 1 drink of alcohol per day; avoid smoking/tobacco   Limit animal fats in diet for cholesterol and heart health - choose grass fed whenever available   Avoid highly processed foods, and foods high in saturated/trans fats   Aim for low stress - take time to unwind and care for your mental health   Aim for 150 min of moderate intensity exercise weekly for heart health, and weights twice weekly for  bone health   Aim for 7-9 hours of sleep daily   When it comes to diets, agreement about the perfect plan isnt easy to find, even among the experts. Experts at the [facility] of [employer] developed an idea known as the Healthy Eating Plate. Just imagine a plate divided into logical, healthy portions.   The emphasis is on diet quality:   Load up on vegetables and fruits - one-half of your plate: Aim for color and variety, and remember that potatoes dont count.   Go for whole grains - one-quarter of your plate: Whole wheat, barley, wheat berries, quinoa, oats, brown rice, and foods made with them. If you want pasta, go with whole wheat pasta.   Protein power - one-quarter of your plate: Fish, chicken, beans, and nuts are all healthy, versatile protein sources. Limit red meat.   The diet, however, does go beyond the plate, offering a few other suggestions.   Use healthy plant oils, such as olive, canola, soy, corn, sunflower and peanut. Check the labels, and avoid partially hydrogenated oil, which have unhealthy trans fats.   If youre thirsty, drink water. Coffee and tea are good in moderation, but skip sugary drinks and limit milk and dairy products to one or two daily servings.   The type of carbohydrate in the diet is more important than the amount. Some sources of carbohydrates,  such as vegetables, fruits, whole grains, and beans-are healthier than others.   Finally, stay active  Signed, Thomasene Ripple, DO  02/24/2023 2:43 PM    [facility]

## 2023-03-17 DIAGNOSIS — E559 Vitamin D deficiency, unspecified: Secondary | ICD-10-CM | POA: Diagnosis not present

## 2023-03-17 DIAGNOSIS — Z125 Encounter for screening for malignant neoplasm of prostate: Secondary | ICD-10-CM | POA: Diagnosis not present

## 2023-03-17 DIAGNOSIS — E039 Hypothyroidism, unspecified: Secondary | ICD-10-CM | POA: Diagnosis not present

## 2023-03-17 DIAGNOSIS — R634 Abnormal weight loss: Secondary | ICD-10-CM | POA: Diagnosis not present

## 2023-03-17 DIAGNOSIS — I10 Essential (primary) hypertension: Secondary | ICD-10-CM | POA: Diagnosis not present

## 2023-03-17 DIAGNOSIS — E785 Hyperlipidemia, unspecified: Secondary | ICD-10-CM | POA: Diagnosis not present

## 2023-03-24 DIAGNOSIS — R82998 Other abnormal findings in urine: Secondary | ICD-10-CM | POA: Diagnosis not present

## 2023-03-24 DIAGNOSIS — F5104 Psychophysiologic insomnia: Secondary | ICD-10-CM | POA: Diagnosis not present

## 2023-03-24 DIAGNOSIS — Z1331 Encounter for screening for depression: Secondary | ICD-10-CM | POA: Diagnosis not present

## 2023-03-24 DIAGNOSIS — N401 Enlarged prostate with lower urinary tract symptoms: Secondary | ICD-10-CM | POA: Diagnosis not present

## 2023-03-24 DIAGNOSIS — I493 Ventricular premature depolarization: Secondary | ICD-10-CM | POA: Diagnosis not present

## 2023-03-24 DIAGNOSIS — R0602 Shortness of breath: Secondary | ICD-10-CM | POA: Diagnosis not present

## 2023-03-24 DIAGNOSIS — E785 Hyperlipidemia, unspecified: Secondary | ICD-10-CM | POA: Diagnosis not present

## 2023-03-24 DIAGNOSIS — Z0000 Encounter for general adult medical examination without abnormal findings: Secondary | ICD-10-CM | POA: Diagnosis not present

## 2023-03-24 DIAGNOSIS — R918 Other nonspecific abnormal finding of lung field: Secondary | ICD-10-CM | POA: Diagnosis not present

## 2023-03-24 DIAGNOSIS — E559 Vitamin D deficiency, unspecified: Secondary | ICD-10-CM | POA: Diagnosis not present

## 2023-03-24 DIAGNOSIS — I10 Essential (primary) hypertension: Secondary | ICD-10-CM | POA: Diagnosis not present

## 2023-03-24 DIAGNOSIS — E039 Hypothyroidism, unspecified: Secondary | ICD-10-CM | POA: Diagnosis not present

## 2023-03-24 DIAGNOSIS — Z1339 Encounter for screening examination for other mental health and behavioral disorders: Secondary | ICD-10-CM | POA: Diagnosis not present

## 2023-03-24 DIAGNOSIS — R413 Other amnesia: Secondary | ICD-10-CM | POA: Diagnosis not present

## 2023-05-20 NOTE — Progress Notes (Unsigned)
 Robert Holder [address] [place]. [place] [address] [phone]       OFFICE FOLLOW UP NOTE  [name] Date of Birth:  Oct 08, 1932 Medical Record Number:  [id number]    Primary neurologist: Dr. Vickey Holder Reason for visit: Memory loss    SUBJECTIVE:  CHIEF COMPLAINT:  No chief complaint on file.   Follow-up visit:  Prior visit: 10/29/2022  Brief HPI:   Robert Holder is a 89 y.o. male who was evaluated by Dr. Vickey Holder on 05/28/2022 for memory loss concerns over the past 2 to 3 years.  MoCA 23/30.  Felt more age-related memory loss and not necessarily beginning of Alzheimer's type dementia.  Recommended Aricept 5 mg daily.  Recommended hearing evaluation with hearing loss complaints possibly affecting memory.  Also recommended completion of MRI brain which showed moderate generalized cerebral atrophy and chronic small vessel disease.   At prior visit, reported cognition overall stable on Aricept 5 mg nightly.  MMSE 26/30.  Wife called office 3 months later requesting increased dosage of Aricept to 10 mg nightly initially concerned memory not improving but then noted memory decline.  Interval history:     Patient accompanied by his wife.  Reports memory has been stable since the prior visit. MOCA 26/30 (prior 23/30). Remains on Aricept 5 mg nightly, denies side effects. Lives at [facility] independent living with his wife.  Routinely follows with PCP Dr. Timothy Lasso.  No questions or concerns at this time.     ROS:   14 system review of systems performed and negative with exception of those listed in HPI  PMH:  Past Medical History:  Diagnosis Date   Cataract    Hypertension     PSH:  Past Surgical History:  Procedure Laterality Date   CATARACT EXTRACTION     EYE SURGERY     NOSE SURGERY      Social History:  Social History   Socioeconomic History   Marital status: Married    Spouse name: Not on file   Number of children: Not on  file   Years of education: Not on file   Highest education level: Not on file  Occupational History   Not on file  Tobacco Use   Smoking status: Never   Smokeless tobacco: Never  Substance and Sexual Activity   Alcohol use: Yes    Alcohol/week: 1.0 standard drink of alcohol    Types: 1 Glasses of wine per week   Drug use: Not on file   Sexual activity: Not on file  Other Topics Concern   Not on file  Social History Narrative   Not on file   Social Drivers of Health   Financial Resource Strain: Not on file  Food Insecurity: Not on file  Transportation Needs: Not on file  Physical Activity: Not on file  Stress: Not on file  Social Connections: Not on file  Intimate Partner Violence: Not on file    Family History: No family history on file.  Medications:   Current Outpatient Medications on File Prior to Visit  Medication Sig Dispense Refill   acebutolol (SECTRAL) 200 MG capsule TAKE 1 CAPSULE(200 MG) BY MOUTH TWICE DAILY 180 capsule 3   acetaminophen (TYLENOL) 500 MG tablet Take 1,000 mg by mouth daily.     aspirin EC 81 MG tablet Take 81 mg by mouth daily.     donepezil (ARICEPT) 10 MG tablet Take 1 tablet (10 mg total) by mouth at bedtime. 90 tablet  1   glucosamine-chondroitin 500-400 MG tablet Take 1 tablet by mouth daily.     levothyroxine (SYNTHROID) 137 MCG tablet Take 137 mcg by mouth daily.     losartan (COZAAR) 100 MG tablet      Multiple Vitamins-Minerals (PRESERVISION AREDS 2 PO) Take 1 tablet by mouth daily.     niacin (NIASPAN) 500 MG CR tablet Take 500 mg by mouth 2 (two) times daily.     simvastatin (ZOCOR) 20 MG tablet Take 20 mg by mouth daily at 6 PM.     Vitamin D, Ergocalciferol, (DRISDOL) 1.25 MG (50000 UNIT) CAPS capsule Take 1 capsule (50,000 Units total) by mouth once a week. 12 capsule 0   No current facility-administered medications on file prior to visit.    Allergies:   Allergies  Allergen Reactions   Ciprofloxacin     Other reaction(s):  LE weakness   Zithromax [Azithromycin]       OBJECTIVE:  Physical Exam  There were no vitals filed for this visit.  There is no height or weight on file to calculate BMI. No results found.  General: well developed, well nourished, very pleasant elderly Caucasian male, seated, in no evident distress Head: head normocephalic and atraumatic.   Neck: supple with no carotid or supraclavicular bruits Cardiovascular: regular rate and rhythm, no murmurs Musculoskeletal: no deformity Skin:  no rash/petichiae Vascular:  Normal pulses all extremities   Neurologic Exam Mental Status: Awake and fully alert. Oriented to place and time. Recent memory impaired and remote memory intact. Attention span, concentration and fund of knowledge appropriate. Mood and affect appropriate.  Cranial Nerves: Pupils equal, briskly reactive to light. Extraocular movements full without nystagmus. Visual fields full to confrontation. Hearing intact. Facial sensation intact. Face, tongue, palate moves normally and symmetrically.  Motor: Normal bulk and tone. Normal strength in all tested extremity muscles Sensory.: intact to touch , pinprick , position and vibratory sensation.  Coordination: Rapid alternating movements normal in all extremities. Finger-to-nose and heel-to-shin performed accurately bilaterally. Gait and Station: Arises from chair without difficulty. Stance is normal. Gait demonstrates normal stride length and balance without use of AD. Tandem walk and heel toe without difficulty.  Reflexes: 1+ and symmetric. Toes downgoing.      10/29/2022   12:49 PM 05/28/2022    2:19 PM  Montreal Cognitive Assessment   Visuospatial/ Executive (0/5) 4 3  Naming (0/3) 3 3  Attention: Read list of digits (0/2) 2 2  Attention: Read list of letters (0/1) 1 1  Attention: Serial 7 subtraction starting at 100 (0/3) 3 3  Language: Repeat phrase (0/2) 2 2  Language : Fluency (0/1) 1 1  Abstraction (0/2) 2 2  Delayed  Recall (0/5) 2 0  Orientation (0/6) 6 6  Total 26 23         ASSESSMENT/PLAN: Robert Holder is a 89 y.o. year old male with 2 to 3-year history of memory loss.     MCI:  Likely more age related MOCA ***/30 (prior 26/30) Continue Aricept 5 mg nightly - can consider increasing to 10 mg nightly if needed but as memory currently stable will continue current dose Advised to check with PCP if B12 levels have been checked recently, is currently on levothyroxine for hypothyroidism and vitamin D for vitamin D deficiency MRI brain moderate generalized atrophy and chronic small vessel disease     Follow up in 6 months or call earlier if needed   CC:  PCP: Creola Corn, MD  I spent 30 minutes of face-to-face and non-face-to-face time with patient and wife.  This included previsit chart review, lab review, study review, order entry, electronic health record documentation, patient and wife education and discussion regarding above diagnoses and treatment plan and answered all other questions to patient's satisfaction   Ihor Austin, [facility]  [facility] [address] [place], [place] [address]  Phone [phone] Fax [phone] Note: This document was prepared with digital dictation and possible smart phrase technology. Any transcriptional errors that result from this process are unintentional.

## 2023-05-21 ENCOUNTER — Encounter: Payer: Self-pay | Admitting: Adult Health

## 2023-05-21 ENCOUNTER — Ambulatory Visit (INDEPENDENT_AMBULATORY_CARE_PROVIDER_SITE_OTHER): Payer: Medicare Other | Admitting: Adult Health

## 2023-05-21 VITALS — BP 148/82 | HR 48 | Wt 191.0 lb

## 2023-05-21 DIAGNOSIS — G3184 Mild cognitive impairment, so stated: Secondary | ICD-10-CM

## 2023-05-21 MED ORDER — DONEPEZIL HCL 10 MG PO TABS: 10.0000 mg | ORAL_TABLET | Freq: Every day | ORAL | 3 refills | Status: DC

## 2023-05-21 NOTE — Patient Instructions (Addendum)
 Your Plan:  Continue Aricept 10 mg nightly  Ensure routine memory exercises as well as routine physical activity, ensuring good sleep and healthy diet     Follow-up in 6 months or call earlier if needed     Thank you for coming to see [place] at [facility]. I hope we have been able to provide you high quality care today.  You may receive a patient satisfaction survey over the next few weeks. We would appreciate your feedback and comments so that we may continue to improve ourselves and the health of our patients.

## 2023-06-05 DIAGNOSIS — L821 Other seborrheic keratosis: Secondary | ICD-10-CM | POA: Diagnosis not present

## 2023-06-05 DIAGNOSIS — D2261 Melanocytic nevi of right upper limb, including shoulder: Secondary | ICD-10-CM | POA: Diagnosis not present

## 2023-06-05 DIAGNOSIS — Z85828 Personal history of other malignant neoplasm of skin: Secondary | ICD-10-CM | POA: Diagnosis not present

## 2023-06-05 DIAGNOSIS — L814 Other melanin hyperpigmentation: Secondary | ICD-10-CM | POA: Diagnosis not present

## 2023-06-05 DIAGNOSIS — D225 Melanocytic nevi of trunk: Secondary | ICD-10-CM | POA: Diagnosis not present

## 2023-06-05 DIAGNOSIS — C44619 Basal cell carcinoma of skin of left upper limb, including shoulder: Secondary | ICD-10-CM | POA: Diagnosis not present

## 2023-06-05 DIAGNOSIS — D1801 Hemangioma of skin and subcutaneous tissue: Secondary | ICD-10-CM | POA: Diagnosis not present

## 2023-06-05 DIAGNOSIS — L570 Actinic keratosis: Secondary | ICD-10-CM | POA: Diagnosis not present

## 2023-06-05 DIAGNOSIS — C44319 Basal cell carcinoma of skin of other parts of face: Secondary | ICD-10-CM | POA: Diagnosis not present

## 2023-09-15 DIAGNOSIS — E785 Hyperlipidemia, unspecified: Secondary | ICD-10-CM | POA: Diagnosis not present

## 2023-09-15 DIAGNOSIS — I10 Essential (primary) hypertension: Secondary | ICD-10-CM | POA: Diagnosis not present

## 2023-09-15 DIAGNOSIS — E663 Overweight: Secondary | ICD-10-CM | POA: Diagnosis not present

## 2023-09-15 DIAGNOSIS — E559 Vitamin D deficiency, unspecified: Secondary | ICD-10-CM | POA: Diagnosis not present

## 2023-09-15 DIAGNOSIS — E039 Hypothyroidism, unspecified: Secondary | ICD-10-CM | POA: Diagnosis not present

## 2023-09-15 DIAGNOSIS — I700 Atherosclerosis of aorta: Secondary | ICD-10-CM | POA: Diagnosis not present

## 2023-09-15 DIAGNOSIS — M1711 Unilateral primary osteoarthritis, right knee: Secondary | ICD-10-CM | POA: Diagnosis not present

## 2023-09-15 DIAGNOSIS — R4189 Other symptoms and signs involving cognitive functions and awareness: Secondary | ICD-10-CM | POA: Diagnosis not present

## 2023-09-15 DIAGNOSIS — K30 Functional dyspepsia: Secondary | ICD-10-CM | POA: Diagnosis not present

## 2023-09-15 DIAGNOSIS — N401 Enlarged prostate with lower urinary tract symptoms: Secondary | ICD-10-CM | POA: Diagnosis not present

## 2023-09-15 DIAGNOSIS — E871 Hypo-osmolality and hyponatremia: Secondary | ICD-10-CM | POA: Diagnosis not present

## 2023-09-15 DIAGNOSIS — I493 Ventricular premature depolarization: Secondary | ICD-10-CM | POA: Diagnosis not present

## 2023-10-11 ENCOUNTER — Other Ambulatory Visit: Payer: Self-pay | Admitting: Cardiology

## 2023-10-28 DIAGNOSIS — R351 Nocturia: Secondary | ICD-10-CM | POA: Diagnosis not present

## 2023-10-28 DIAGNOSIS — R972 Elevated prostate specific antigen [PSA]: Secondary | ICD-10-CM | POA: Diagnosis not present

## 2023-10-28 DIAGNOSIS — N401 Enlarged prostate with lower urinary tract symptoms: Secondary | ICD-10-CM | POA: Diagnosis not present

## 2023-11-11 DIAGNOSIS — L821 Other seborrheic keratosis: Secondary | ICD-10-CM | POA: Diagnosis not present

## 2023-11-11 DIAGNOSIS — L814 Other melanin hyperpigmentation: Secondary | ICD-10-CM | POA: Diagnosis not present

## 2023-11-11 DIAGNOSIS — D1801 Hemangioma of skin and subcutaneous tissue: Secondary | ICD-10-CM | POA: Diagnosis not present

## 2023-11-11 DIAGNOSIS — D485 Neoplasm of uncertain behavior of skin: Secondary | ICD-10-CM | POA: Diagnosis not present

## 2023-11-11 DIAGNOSIS — D225 Melanocytic nevi of trunk: Secondary | ICD-10-CM | POA: Diagnosis not present

## 2023-11-11 DIAGNOSIS — Z85828 Personal history of other malignant neoplasm of skin: Secondary | ICD-10-CM | POA: Diagnosis not present

## 2023-11-11 DIAGNOSIS — L570 Actinic keratosis: Secondary | ICD-10-CM | POA: Diagnosis not present

## 2023-12-13 DIAGNOSIS — Z23 Encounter for immunization: Secondary | ICD-10-CM | POA: Diagnosis not present

## 2023-12-16 ENCOUNTER — Ambulatory Visit: Admitting: Adult Health

## 2023-12-16 ENCOUNTER — Encounter: Payer: Self-pay | Admitting: Adult Health

## 2023-12-16 VITALS — BP 159/85 | HR 51 | Ht 70.0 in | Wt 193.0 lb

## 2023-12-16 DIAGNOSIS — G3184 Mild cognitive impairment, so stated: Secondary | ICD-10-CM

## 2023-12-16 MED ORDER — DONEPEZIL HCL 10 MG PO TABS: 10.0000 mg | ORAL_TABLET | Freq: Every day | ORAL | 3 refills | Status: AC

## 2023-12-16 NOTE — Progress Notes (Signed)
 [facility] [address] [place]. [place] [address] [phone]       OFFICE FOLLOW UP NOTE  [name] Date of Birth:  04-Jul-1932 Medical Record Number:  [id number]    Primary neurologist: Dr. Chalice Reason for visit: Memory loss    SUBJECTIVE:  CHIEF COMPLAINT:  Chief Complaint  Patient presents with   Memory Loss    Rm 8 with spouse  Pt is well and stable, reports memory is stable. No new concerns.     Follow-up visit:  Prior visit: 05/21/2023  Brief HPI:   Robert Holder is a 89 y.o. male who was evaluated by Dr. Chalice on 05/28/2022 for memory loss concerns over the past 2 to 3 years.  MoCA 23/30.  Felt more age-related memory loss and not necessarily beginning of Alzheimer's type dementia.  Recommended Aricept  5 mg daily.  Recommended hearing evaluation with hearing loss complaints possibly affecting memory.  Also recommended completion of MRI brain which showed moderate generalized cerebral atrophy and chronic small vessel disease.   At prior visit, reported cognition overall stable on Aricept  10 mg nightly.  MOCA 27/30.    Interval history:  Patient returns for 30-month follow-up visit accompanied by his wife.  Reports overall his memory has been stable, denies any progression.  Wife mentions he continues to repeat the same question multiple times. He is HOH and does wear hearing aides.  Continues to maintain ADLs independently.  Reports he sleeps well, appetite is good.  No behavioral concerns. He does enjoy walking daily with his wife, still does not do any cognitive exercises.  Remains on Aricept  10 mg nightly, denies side effects.      ROS:   14 system review of systems performed and negative with exception of those listed in HPI  PMH:  Past Medical History:  Diagnosis Date   Cataract    Hypertension     PSH:  Past Surgical History:  Procedure Laterality Date   CATARACT EXTRACTION     EYE SURGERY     NOSE SURGERY       Social History:  Social History   Socioeconomic History   Marital status: Married    Spouse name: Not on file   Number of children: Not on file   Years of education: Not on file   Highest education level: Not on file  Occupational History   Not on file  Tobacco Use   Smoking status: Never   Smokeless tobacco: Never  Substance and Sexual Activity   Alcohol  use: Yes    Alcohol /week: 1.0 standard drink of alcohol     Types: 1 Glasses of wine per week   Drug use: Not on file   Sexual activity: Not on file  Other Topics Concern   Not on file  Social History Narrative   Not on file   Social Drivers of Health   Financial Resource Strain: Not on file  Food Insecurity: Not on file  Transportation Needs: Not on file  Physical Activity: Not on file  Stress: Not on file  Social Connections: Not on file  Intimate Partner Violence: Not on file    Family History: History reviewed. No pertinent family history.  Medications:   Current Outpatient Medications on File Prior to Visit  Medication Sig Dispense Refill   acebutolol  (SECTRAL ) 200 MG capsule TAKE 1 CAPSULE(200 MG) BY MOUTH TWICE DAILY 180 capsule 3   acetaminophen (TYLENOL) 500 MG tablet Take 1,000 mg by mouth daily.  aspirin EC 81 MG tablet Take 81 mg by mouth daily.     glucosamine-chondroitin 500-400 MG tablet Take 1 tablet by mouth daily.     levothyroxine (SYNTHROID) 137 MCG tablet Take 137 mcg by mouth daily.     losartan (COZAAR) 100 MG tablet      Multiple Vitamins-Minerals (PRESERVISION AREDS 2 PO) Take 1 tablet by mouth daily.     niacin (NIASPAN) 500 MG CR tablet Take 500 mg by mouth 2 (two) times daily.     simvastatin (ZOCOR) 20 MG tablet Take 20 mg by mouth daily at 6 PM.     Vitamin D , Ergocalciferol , (DRISDOL ) 1.25 MG (50000 UNIT) CAPS capsule Take 1 capsule (50,000 Units total) by mouth once a week. 12 capsule 0   No current facility-administered medications on file prior to visit.    Allergies:    Allergies  Allergen Reactions   Ciprofloxacin     Other reaction(s): LE weakness   Zithromax [Azithromycin]       OBJECTIVE:  Physical Exam  Vitals:   12/16/23 1056  BP: (!) 159/85  Pulse: (!) 51  Weight: 193 lb (87.5 kg)  Height: 5' 10 (1.778 m)   Body mass index is 27.69 kg/m. No results found.  General: well developed, well nourished, very pleasant elderly Caucasian male, seated, in no evident distress  Neurologic Exam Mental Status: Awake and fully alert. Oriented to place and time. Recent memory impaired and remote memory intact. Attention span, concentration and fund of knowledge appropriate. Mood and affect appropriate.  Cranial Nerves: Pupils equal, briskly reactive to light. Extraocular movements full without nystagmus. Visual fields full to confrontation. Hearing intact. Facial sensation intact. Face, tongue, palate moves normally and symmetrically.  Motor: Normal bulk and tone. Normal strength in all tested extremity muscles Coordination: Rapid alternating movements normal in all extremities. Finger-to-nose and heel-to-shin performed accurately bilaterally. Gait and Station: Arises from chair without difficulty. Stance is hunched. Gait demonstrates decreased stride length and step height bilaterally with mild imbalance without use of AD.      05/21/2023   11:07 AM 10/29/2022   12:49 PM 05/28/2022    2:19 PM  Montreal Cognitive Assessment   Visuospatial/ Executive (0/5) 5 4 3   Naming (0/3) 3 3 3   Attention: Read list of digits (0/2) 2 2 2   Attention: Read list of letters (0/1) 1 1 1   Attention: Serial 7 subtraction starting at 100 (0/3) 3 3 3   Language: Repeat phrase (0/2) 2 2 2   Language : Fluency (0/1) 1 1 1   Abstraction (0/2) 2 2 2   Delayed Recall (0/5) 2 2 0  Orientation (0/6) 6 6 6   Total 27 26 23          ASSESSMENT/PLAN: Robert Holder is a 89 y.o. year old male with onset of memory difficulties around 2022 after COVID    MCI:  Overall  stable, no behavioral concerns MOCA prior 27/30 - repeat at f/u visit Continue Aricept  10mg  nightly  Discussed importance of routine memory exercises and physical activity, ensuring good sleep and healthy diet MRI brain moderate generalized atrophy and chronic small vessel disease No family hx of AD Discussed community resources for both patient and family support     Follow up in 6-8 months or call earlier if needed    CC:  PCP: Onita Rush, MD    I personally spent a total of 25 minutes in the care of the patient today including preparing to see the patient, performing a  medically appropriate exam/evaluation, counseling and educating, placing orders, and documenting clinical information in the EHR.   Harlene Bogaert, AGNP-BC  [facility] [address] [place], [place] [address]  Phone [phone] Fax [phone] Note: This document was prepared with digital dictation and possible smart phrase technology. Any transcriptional errors that result from this process are unintentional.

## 2023-12-16 NOTE — Patient Instructions (Addendum)
 Your Plan:  Continue Aricept  10mg  nightly   Highly recommend doing routine memory exercises at home which can help slow your memory decline     Follow up in 6-8 months or call earlier if needed     Thank you for coming to see us  at [facility]. I hope we have been able to provide you high quality care today.  You may receive a patient satisfaction survey over the next few weeks. We would appreciate your feedback and comments so that we may continue to improve ourselves and the health of our patients.

## 2023-12-22 ENCOUNTER — Ambulatory Visit: Admitting: Adult Health

## 2024-07-07 ENCOUNTER — Ambulatory Visit: Admitting: Adult Health
